# Patient Record
Sex: Male | Born: 1985 | Race: White | Hispanic: No | Marital: Single | Smoking: Never smoker
Health system: Southern US, Community
[De-identification: ages and names within clinical notes are randomized; demographics above are authoritative.]

---
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
## 2006-02-06 ENCOUNTER — Emergency Department (HOSPITAL_COMMUNITY): Admission: EM | Admit: 2006-02-06 | Discharge: 2006-02-06 | Payer: Self-pay | Admitting: Emergency Medicine

## 2007-05-17 IMAGING — CT CT HEAD W/O CM
1 series · 16 of 30 positions shown, 20 images · IV contrast (agent unspecified)
Comparison: 07/25/02.

CLINICAL DATA: Head injury with trauma.  Loss of consciousness.  Headache. 
 HEAD CT WITHOUT CONTRAST:
TECHNIQUE: Contiguous axial images were obtained from the base of the skull through the vertex according to standard protocol without contrast.

[Series 2: trauma head · axial · 0.49mm/px · z∈[+110,+246]mm · 16 of 32 slices shown, 20 images]
[im 2/32  brain]
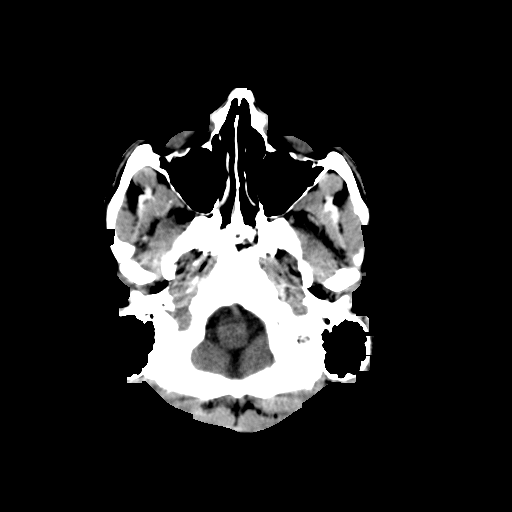
[im 2/32  bone]
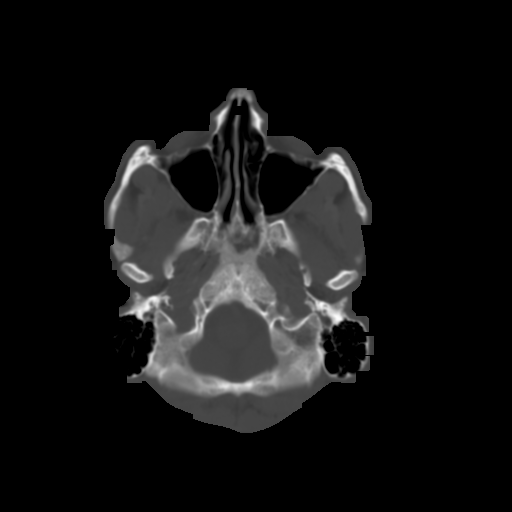
[im 4/32  brain]
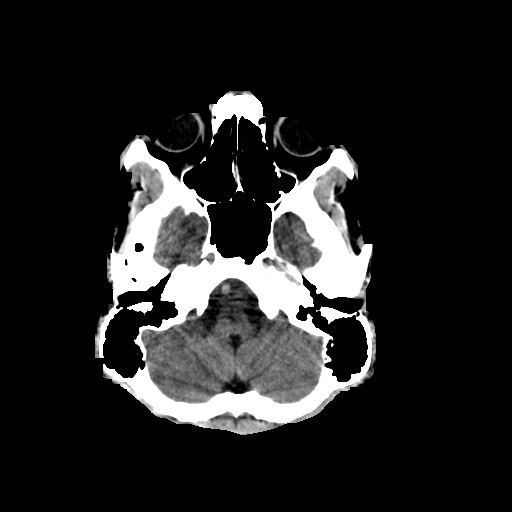
[im 6/32  brain]
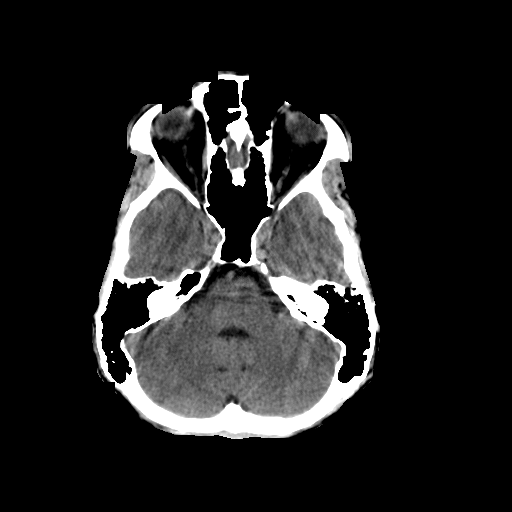
[im 8/32  brain]
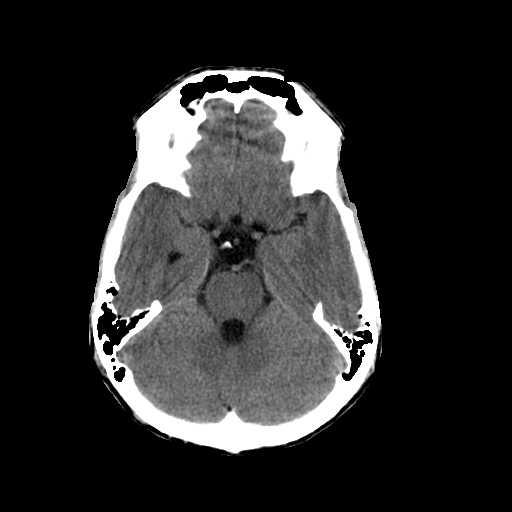
[im 9/32  brain]
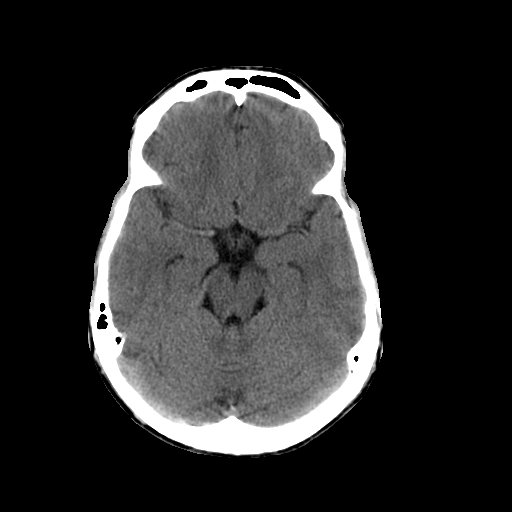
[im 9/32  bone]
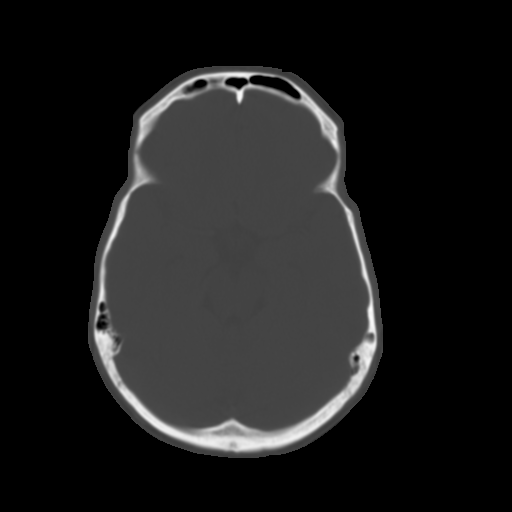
[im 11/32  brain]
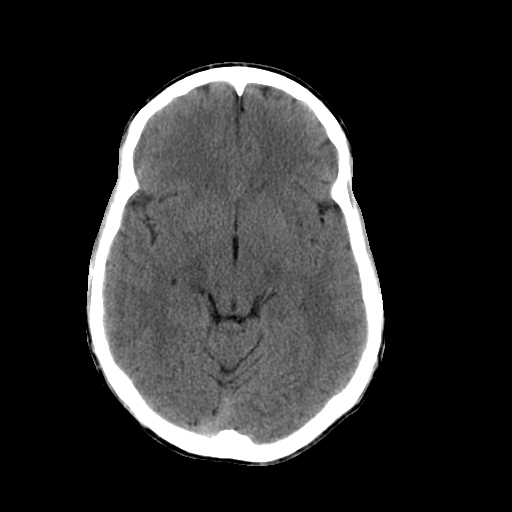
[im 13/32  brain]
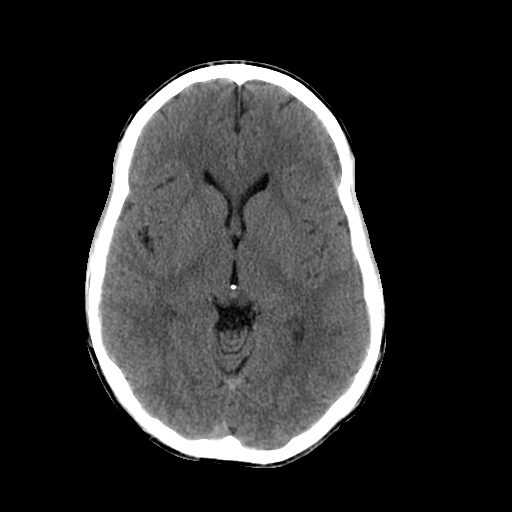
[im 15/32  brain]
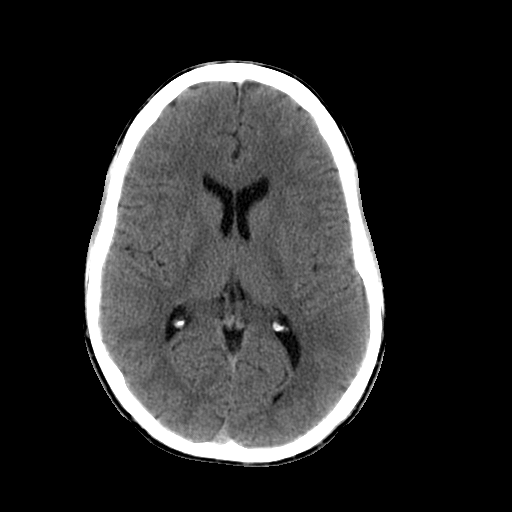
[im 17/32  brain]
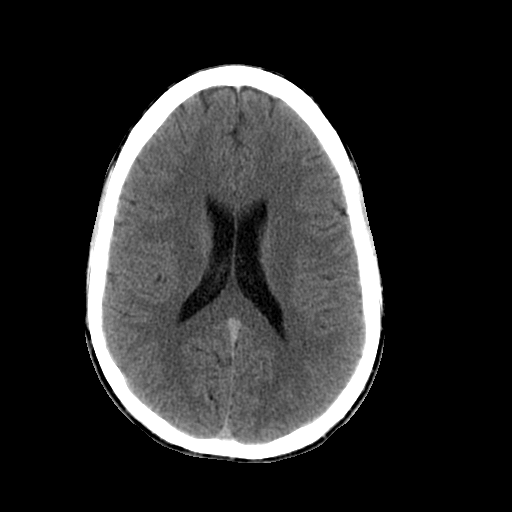
[im 17/32  bone]
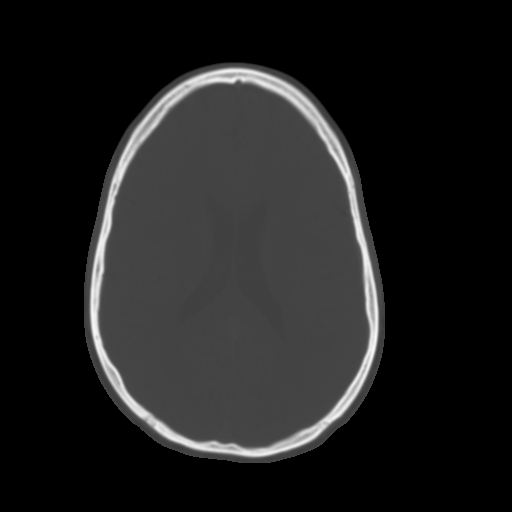
[im 19/32  brain]
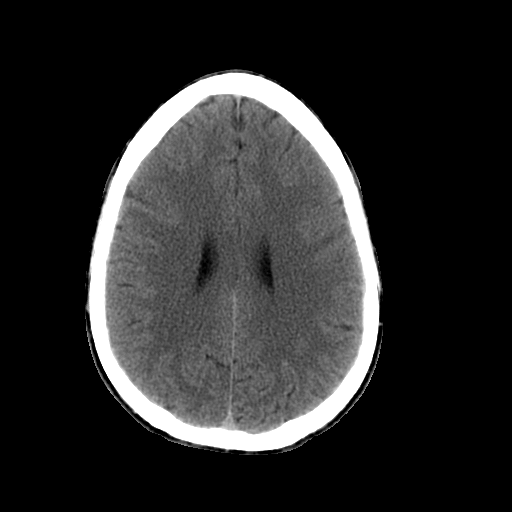
[im 21/32  brain]
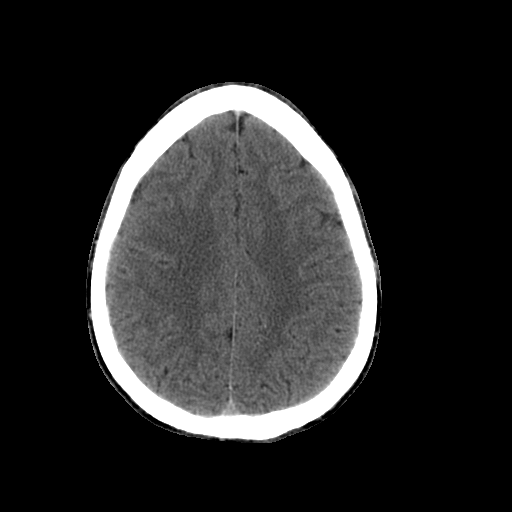
[im 23/32  brain]
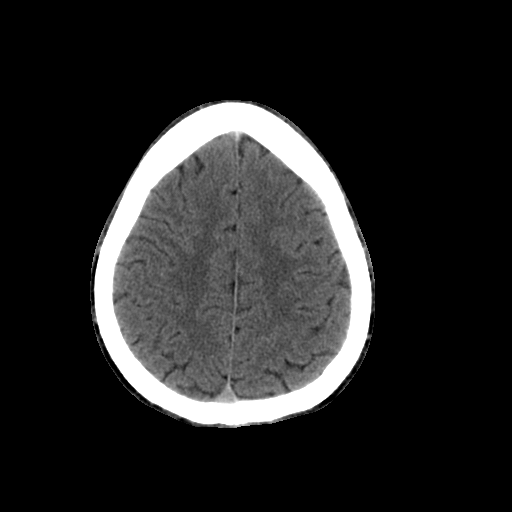
[im 24/32  brain]
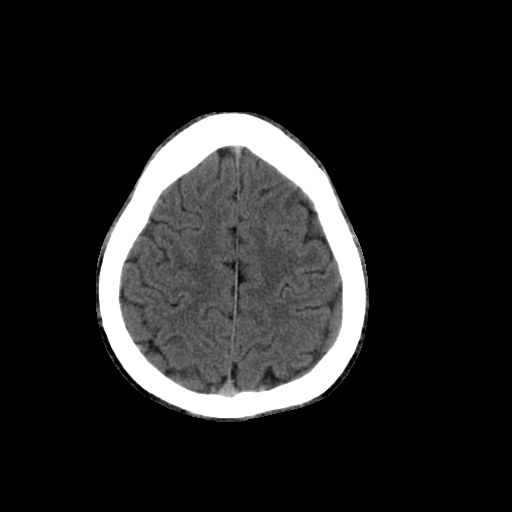
[im 24/32  bone]
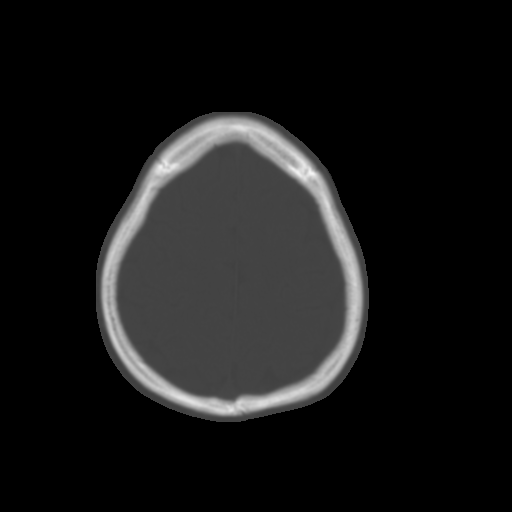
[im 26/32  brain]
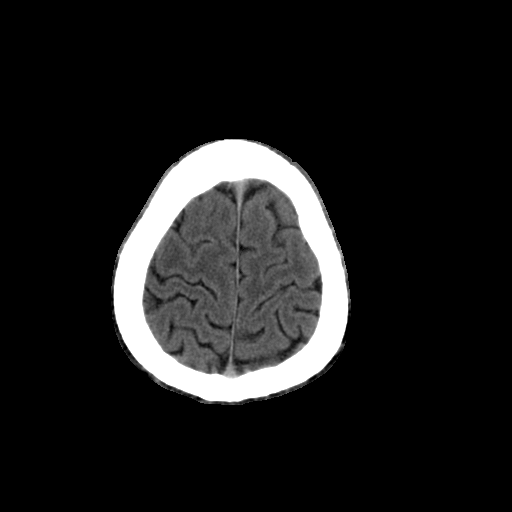
[im 28/32  brain]
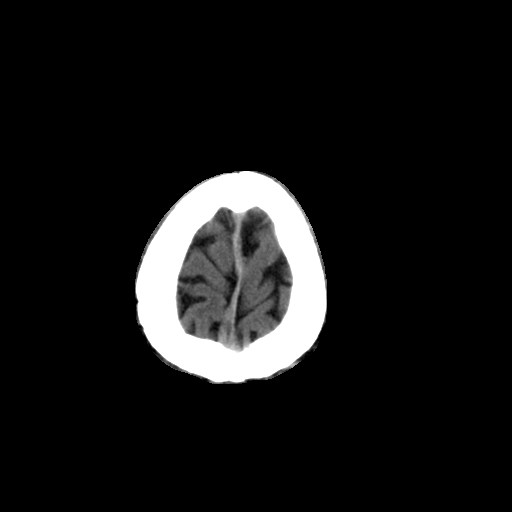
[im 30/32  brain]
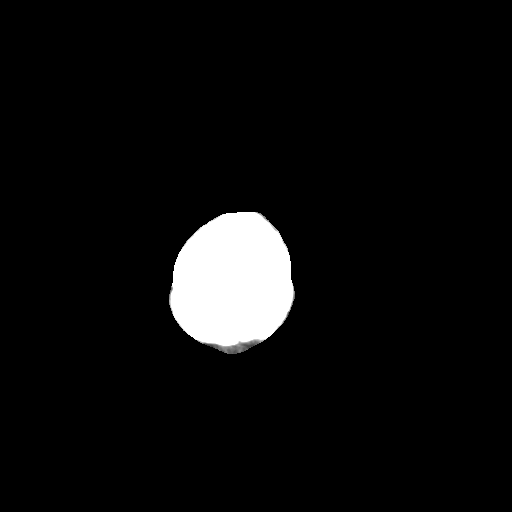

[16 of 30 positions shown; findings below may reference images not displayed]

FINDINGS: There is no evidence of intracranial hemorrhage, brain edema, or mass effect.  No other intra-axial abnormalities are seen, and the ventricles are within normal limits.  No abnormal extra-axial fluid collections or masses are identified.  No skull abnormalities are noted.
IMPRESSION: Negative non-contrast head CT.

## 2007-05-17 IMAGING — CR DG CERVICAL SPINE COMPLETE 4+V
6 series · 6 of 6 positions shown · non-contrast
Comparison: None.

CLINICAL DATA: Head trauma.  Injury at work.  Struck in the back of the head.
 CERVICAL SPINE ? 5 VIEW:

[view not recorded (1 of 6)]
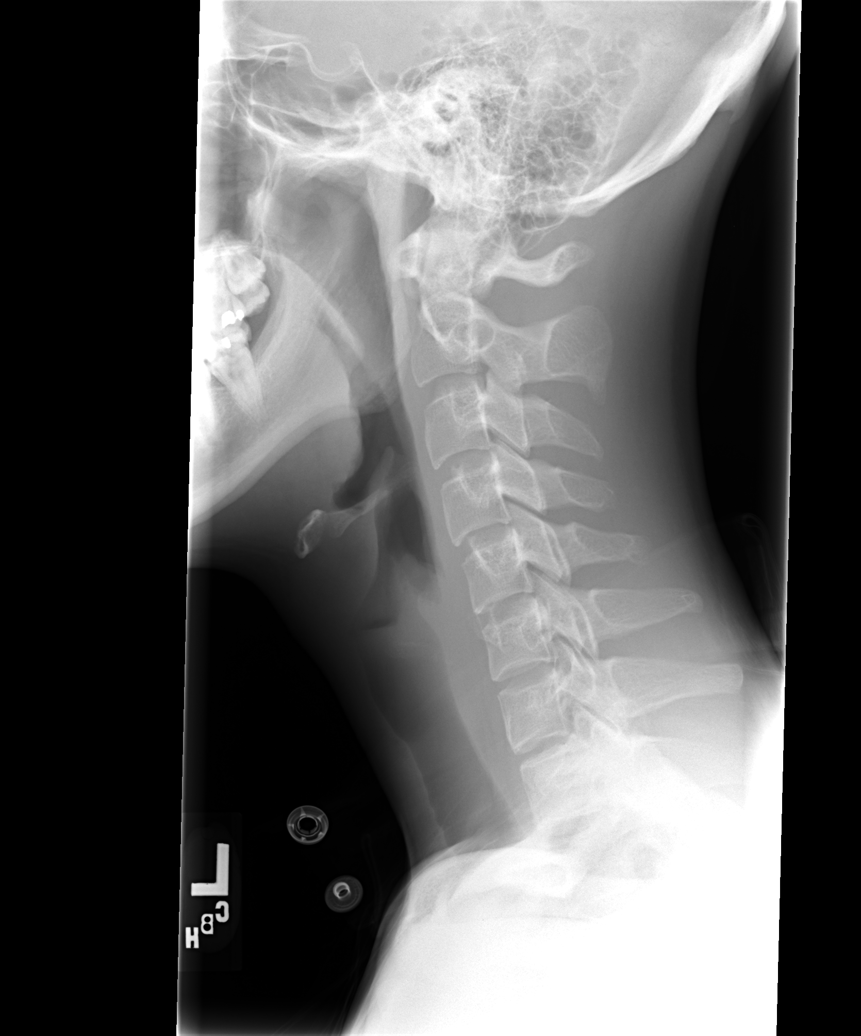

[view not recorded (2 of 6)]
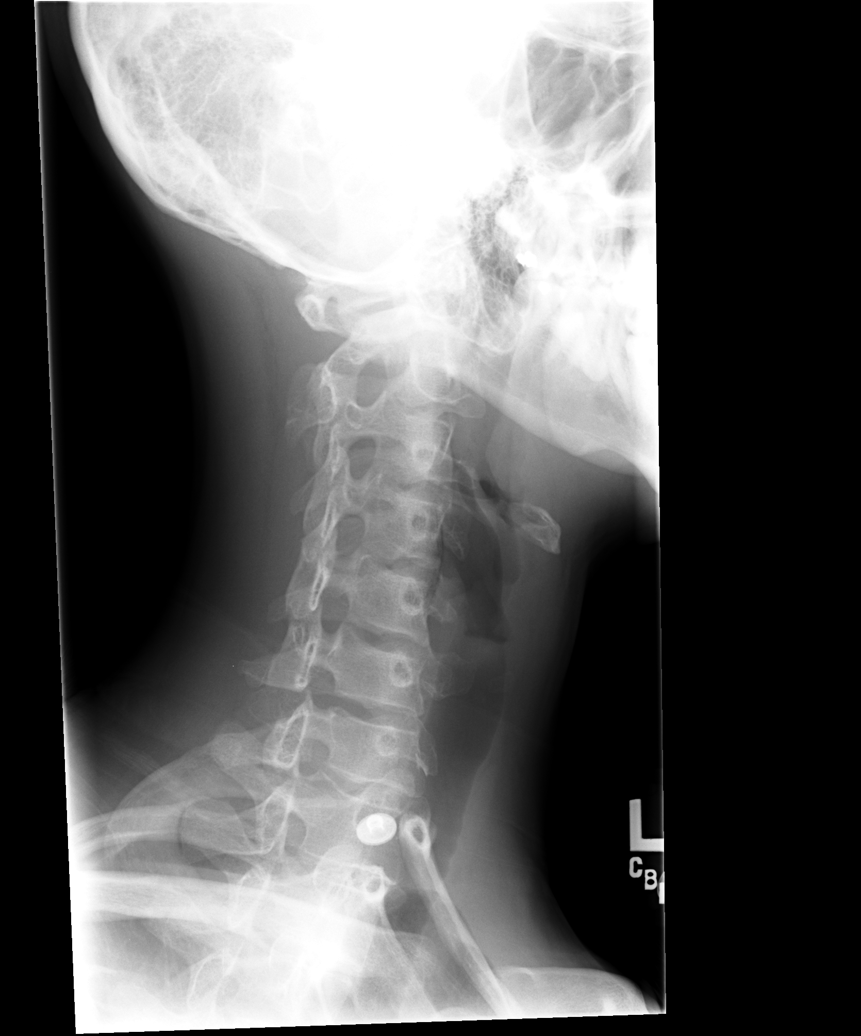

[view not recorded (3 of 6)]
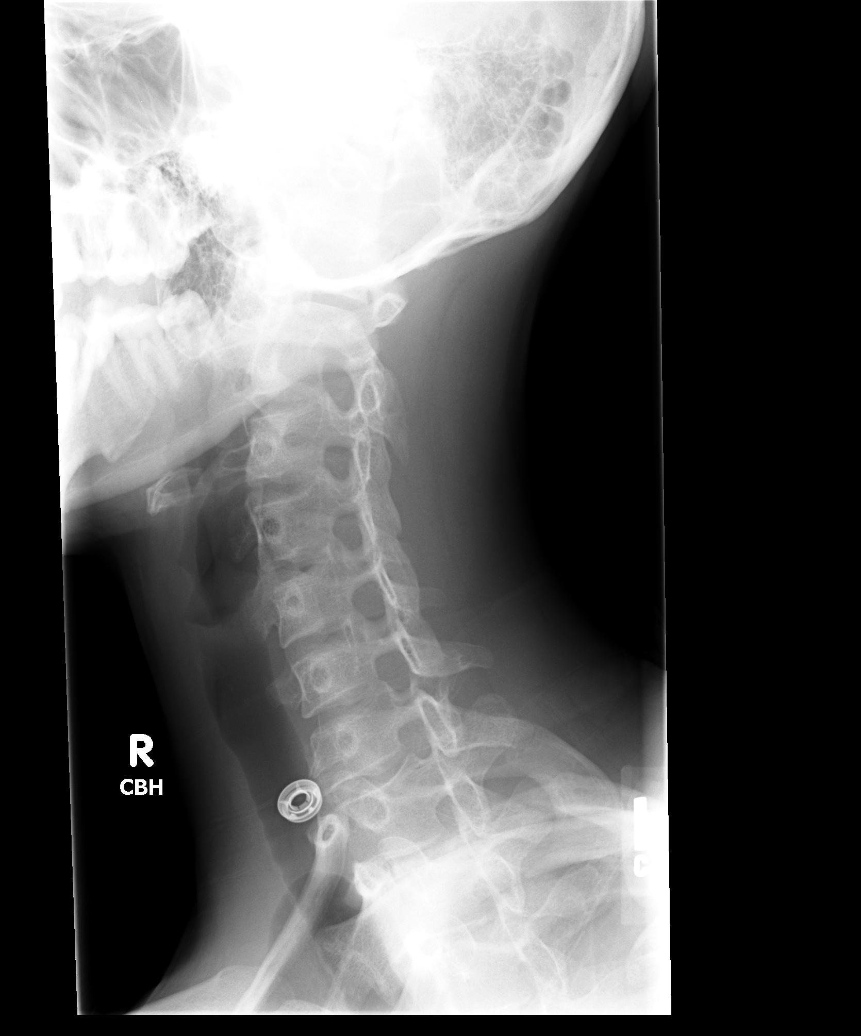

[view not recorded (4 of 6)]
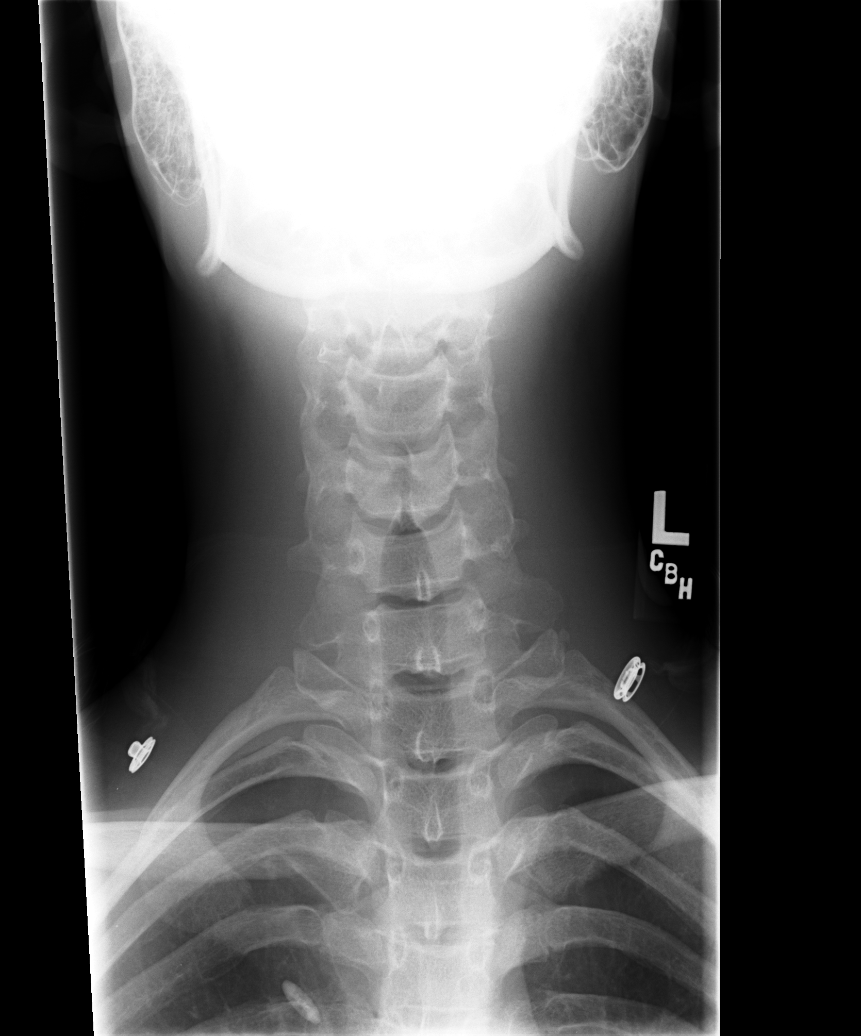

[view not recorded (5 of 6)]
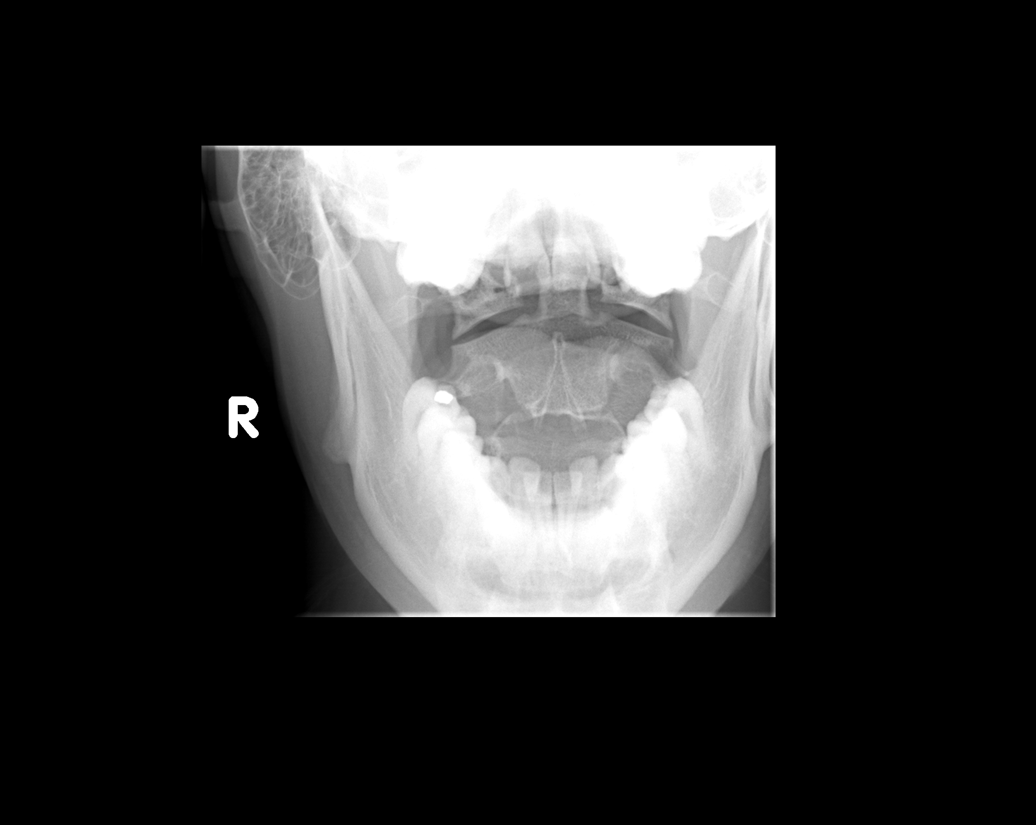

[view not recorded (6 of 6)]
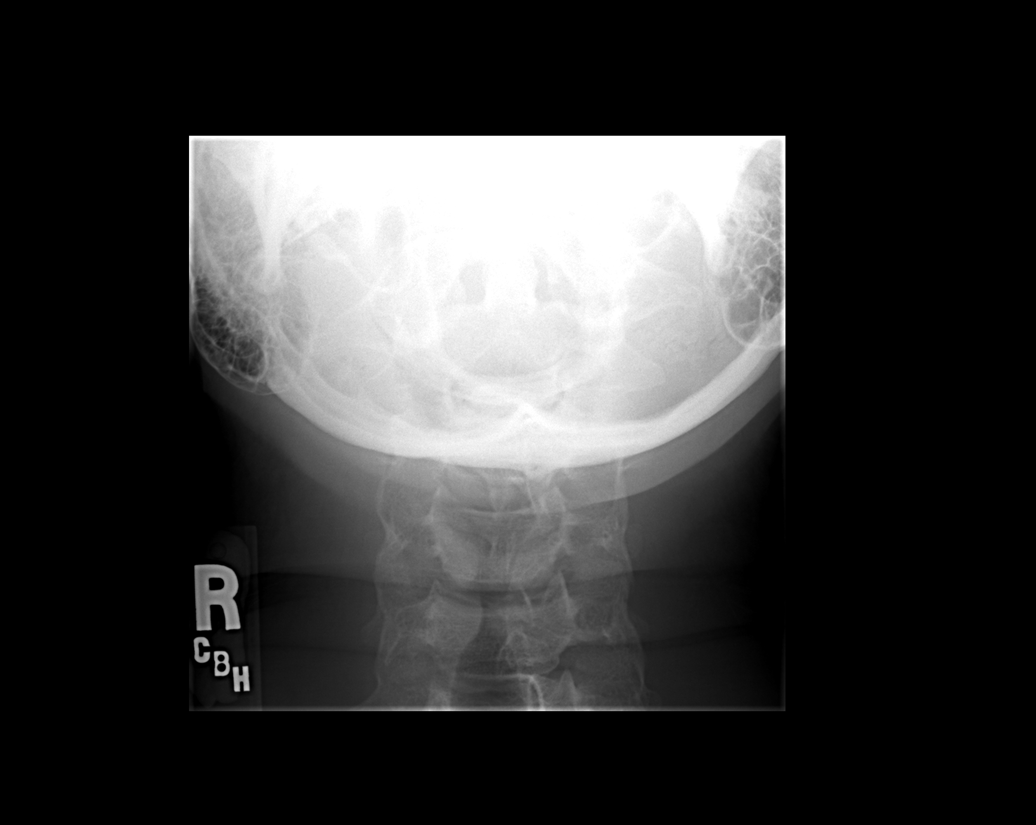

[6 of 6 positions shown; findings below may reference images not displayed]

FINDINGS: There is no evidence of cervical spine fracture or prevertebral soft tissue swelling.  Alignment is normal.  No other significant bone abnormalities are identified.
IMPRESSION: Negative.

## 2009-06-30 ENCOUNTER — Encounter: Admission: RE | Admit: 2009-06-30 | Discharge: 2009-06-30 | Payer: Self-pay | Admitting: Internal Medicine

## 2010-05-09 ENCOUNTER — Telehealth (INDEPENDENT_AMBULATORY_CARE_PROVIDER_SITE_OTHER): Payer: Self-pay

## 2010-05-09 ENCOUNTER — Encounter (INDEPENDENT_AMBULATORY_CARE_PROVIDER_SITE_OTHER): Payer: Self-pay

## 2010-05-10 ENCOUNTER — Ambulatory Visit: Payer: Self-pay | Admitting: Gastroenterology

## 2010-05-11 ENCOUNTER — Ambulatory Visit: Payer: Self-pay | Admitting: Gastroenterology

## 2010-05-12 ENCOUNTER — Encounter: Payer: Self-pay | Admitting: Gastroenterology

## 2010-05-25 ENCOUNTER — Ambulatory Visit: Payer: Self-pay | Admitting: Gastroenterology

## 2010-05-25 DIAGNOSIS — K5150 Left sided colitis without complications: Secondary | ICD-10-CM | POA: Insufficient documentation

## 2010-10-18 NOTE — Miscellaneous (Signed)
Summary: Canasa Rx  Clinical Lists Changes  Medications: Added new medication of CANASA 1000 MG  SUPP (MESALAMINE) One suppository per rectum every day. - Signed Rx of CANASA 1000 MG  SUPP (MESALAMINE) One suppository per rectum every day.;  #30 x 5;  Signed;  Entered by: Durwin Glaze RN;  Authorized by: Meryl Dare MD [facility];  Method [facility]: [facility]  [address] [phone]*, [address] [place], [place]  [address], Ph: [phone] or [phone], Fax: [phone]    Prescriptions: CANASA 1000 MG  SUPP (MESALAMINE) One suppository per rectum every day.  #30 x 5   Entered by:   Durwin Glaze RN   Authorized by:   Meryl Dare MD [facility]   Signed by:   Durwin Glaze RN on 05/11/2010   Method used:   [facility]  [address] [phone]* (retail)       [address]       [place], [place]  [address]       Ph: [phone] or [phone]       Fax: [phone]   RxID:   [id number]

## 2010-10-18 NOTE — Procedures (Signed)
Summary: Colonoscopy  Patient: Anthony Cantrell Note: All result statuses are Final unless otherwise noted.  Tests: (1) Colonoscopy (COL)   COL Colonoscopy           DONE     [facility]     [address]     [place], [place]  [address]           COLONOSCOPY PROCEDURE REPORT           PATIENT:  [name], [name]  MR#:  [id number]     BIRTHDATE:  Jan 17, 1986, 23 yrs. old  GENDER:  male     ENDOSCOPIST:  Judie Petit T. Russella Dar, MD, [facility]     Referred by:  Nila Nephew, M.D.     PROCEDURE DATE:  05/11/2010     PROCEDURE:  Colonoscopy with biopsy     ASA CLASS:  Class I     INDICATIONS:  1) hematochezia 2) proctitis and blood noted on     anoscopy by Dr. Elmore Guise.     MEDICATIONS:   Fentanyl 75 mcg IV, Versed 8 mg IV     DESCRIPTION OF PROCEDURE:   After the risks benefits and     alternatives of the procedure were thoroughly explained, informed     consent was obtained.  Digital rectal exam was performed and     revealed no abnormalities.   The LB [id number]-[id number] endoscope     was introduced through the anus and advanced to the terminal ileum     which was intubated for a short distance, without limitations.     The quality of the prep was excellent, using MoviPrep.  The     instrument was then slowly withdrawn as the colon was fully     examined.     <<PROCEDUREIMAGES>>     FINDINGS:  The terminal ileum appeared normal. It had mild, patchy     and erythema. Multiple biopsies were obtained and sent to     pathology.  Proctitis was identified in the rectum from anal verge     to 15cm. It was moderately severe, erythematous and granular.     Multiple biopsies were obtained and sent to pathology. A normal     appearing cecum, ileocecal valve, and appendiceal orifice were     identified. The ascending, hepatic flexure, transverse, splenic     flexure, descending, sigmoid colon appeared unremarkable. Random     biopsies were obtained and sent to pathology.  Retroflexed views     in the  rectum revealed no other findings other than those already     described. The time to cecum =  4.75  minutes. The scope was then     withdrawn (time =  9.5  min) from the patient and the procedure     completed.           COMPLICATIONS:  None           ENDOSCOPIC IMPRESSION:           1) Proctitis, moderately severe           RECOMMENDATIONS:     1) Await pathology results     2) Out patient follow-up in 2-4 weeks     3) Canasa 1000mg  supp hs           Malcolm T. Russella Dar, MD, [facility]           n.     eSIGNED:   Venita Lick. Stark at 05/11/2010 03:40  PM           Shmuel, Girgis, [id number]  Note: An exclamation mark (!) indicates a result that was not dispersed into the flowsheet. Document Creation Date: 05/11/2010 3:42 PM _______________________________________________________________________  (1) Order result status: Final Collection or observation date-time: 05/11/2010 15:29 Requested date-time:  Receipt date-time:  Reported date-time:  Referring Physician:   Ordering Physician: Claudette Head [phone]) Specimen Source:  Source: Launa Grill Order Number: [id number] Lab site:   Appended Document: Colonoscopy colon 04/2020  Appended Document: Colonoscopy     Procedures Next Due Date:    Colonoscopy: 04/2020

## 2010-10-18 NOTE — Assessment & Plan Note (Signed)
Summary: follow up colon/sheri   History of Present Illness Visit Type: Follow-up Visit Primary GI MD: Elie Goody MD [facility] Primary Provider: Nila Nephew, MD Requesting Provider: Nila Nephew, MD Chief Complaint: follow up colonoscopy.  Pt is doing well and has no GI complaints.  He is using suppositories only. History of Present Illness:   This is a return visit for left-sided colitis. Proctitis was endoscopically visible and left-sided colitis was diagnosed on biopsies. He has had an excellent response to Canasa suppositories with complete resolution of symptoms over the past week.   GI Review of Systems      Denies abdominal pain, acid reflux, belching, bloating, chest pain, dysphagia with liquids, dysphagia with solids, heartburn, loss of appetite, nausea, vomiting, vomiting blood, weight loss, and  weight gain.        Denies anal fissure, black tarry stools, change in bowel habit, constipation, diarrhea, diverticulosis, fecal incontinence, heme positive stool, hemorrhoids, irritable bowel syndrome, jaundice, light color stool, liver problems, rectal bleeding, and  rectal pain. Preventive Screening-Counseling & Management  Alcohol-Tobacco     Smoking Status: never  Caffeine-Diet-Exercise     Does Patient Exercise: yes      Drug Use:  no.     Current Medications (verified): 1)  Canasa 1000 Mg  Supp (Mesalamine) .... One Suppository Per Rectum Every Day. 2)  Lialda 1.2 Gm Tbec (Mesalamine) .... 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  Alcohol Use - yes social Daily Caffeine Use--coffee in AM Illicit Drug Use - no Patient gets regular exercise. Smoking Status:  never Drug Use:  no Does Patient Exercise:   yes  Review of Systems       The pertinent positives and negatives are noted as above and in the HPI. All other ROS were reviewed and were negative.   Vital Signs:  Patient profile:   25 year old male Height:      69 inches Weight:      142 pounds BMI:     21.05 Pulse rate:   60 / minute Pulse rhythm:   regular BP sitting:   110 / 66  (left arm) Cuff size:   regular  Vitals Entered By: Francee Piccolo CMA Duncan Dull) (May 25, 2010 9:41 AM)  Physical Exam  General:  Well developed, well nourished, no acute distress. Head:  Normocephalic and atraumatic. Eyes:  PERRLA, no icterus. Mouth:  No deformity or lesions, dentition normal. Lungs:  Clear throughout to auscultation. Heart:  Regular rate and rhythm; no murmurs, rubs,  or bruits. Abdomen:  Soft, nontender and nondistended. No masses, hepatosplenomegaly or hernias noted. Normal bowel sounds. Psych:  Alert and cooperative. Normal mood and affect.  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 Excellent response to Canasa suppositories. He has not filled his Lialda prescription since he was away on vacation. Begin Lialda as prescribed and after 2 weeks taper Canasa supp as outlined and discontinue. 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 Long-term treatment with a 5 ASA agent for suppression of inflammatory response and control of symptoms was discussed with the patient.  Patient Instructions: 1)  Start Lialda 2 tablets by  mouth every morning. 2)  After taking Lialda x 2 weeks reduce Canasa suppositories to every other day x 1 week, then reduce again to every 3 days x week and discontinue.  3)  Please schedule a follow-up appointment in 6 months. 4)  The medication list was reviewed and reconciled.  All changed / newly prescribed medications were explained.  A  complete medication list was provided to the patient / caregiver. 5)  Copy sent to : Nila Nephew, MD

## 2010-10-18 NOTE — Letter (Signed)
Summary: New Patient letter  [facility]  [address] [place], [place] [address]   Phone: [phone]  Fax: [phone]       05/12/2010 MRN: [id number]  Anthony Cantrell [address] [place], [place]  [address]  Dear Mr. BAUGHER,  Welcome to the Gastroenterology Division at [facility].    You are scheduled to see Dr.  Russella Dar  on 05/25/10 at  9:30 on the 3rd floor at [facility], [address]  We ask that you try to arrive at our office 15 minutes prior to your appointment time to allow for check-in.  We would like you to complete the enclosed self-administered evaluation form prior to your visit and bring it with you on the day of your appointment.  We will review it with you.  Also, please bring a complete list of all your medications or, if you prefer, bring the medication bottles and we will list them.  Please bring your insurance card so that we may make a copy of it.  If your insurance requires a referral to see a specialist, please bring your referral form from your primary care physician.  Co-payments are due at the time of your visit and may be paid by cash, check or credit card.     Your office visit will consist of a consult with your physician (includes a physical exam), any laboratory testing he/she may order, scheduling of any necessary diagnostic testing (e.g. x-ray, ultrasound, CT-scan), and scheduling of a procedure (e.g. Endoscopy, Colonoscopy) if required.  Please allow enough time on your schedule to allow for any/all of these possibilities.    If you cannot keep your appointment, please call [phone] to cancel or reschedule prior to your appointment date.  This allows [place] the opportunity to schedule an appointment for another patient in need of care.  If you do not cancel or reschedule by 5 p.m. the business day prior to your appointment date, you will be charged a $50.00 late cancellation/no-show fee.    Thank you for choosing  [facility] for your medical needs.  We appreciate the opportunity to care for you.  Please visit [place] at our website  to learn more about our practice.                     Sincerely,                                                             The Gastroenterology Division   Appended Document: New Patient letter letter mailed to patient's home

## 2010-10-18 NOTE — Letter (Signed)
Summary: [facility] Instructions  [facility]  [address] [place], [place] [address]   Phone: [phone]  Fax: [phone]       Anthony Cantrell    1986/01/24    MRN: [id number]        Procedure Day Dorna Bloom:  Wednesday 05/11/2010     Arrival Time: 2:00 pm     Procedure Time: 3:00 pm     Location of Procedure:                    _x _  [facility] (4th Floor)                        PREPARATION FOR COLONOSCOPY WITH MOVIPREP   Starting 5 days prior to your procedure Friday 8/19 do not eat nuts, seeds, popcorn, corn, beans, peas,  salads, or any raw vegetables.  Do not take any fiber supplements (e.g. Metamucil, Citrucel, and Benefiber).  THE DAY BEFORE YOUR PROCEDURE         DATE: Tuesday 8/23  1.  Drink clear liquids the entire day-NO SOLID FOOD  2.  Do not drink anything colored red or purple.  Avoid juices with pulp.  No orange juice.  3.  Drink at least 64 oz. (8 glasses) of fluid/clear liquids during the day to prevent dehydration and help the prep work efficiently.  CLEAR LIQUIDS INCLUDE: Water Jello Ice Popsicles Tea (sugar ok, no milk/cream) Powdered fruit flavored drinks Coffee (sugar ok, no milk/cream) Gatorade Juice: apple, white grape, white cranberry  Lemonade Clear bullion, consomm, broth Carbonated beverages (any kind) Strained chicken noodle soup Hard Candy                             4.  In the morning, mix first dose of MoviPrep solution:    Empty 1 Pouch A and 1 Pouch B into the disposable container    Add lukewarm drinking water to the top line of the container. Mix to dissolve    Refrigerate (mixed solution should be used within 24 hrs)  5.  Begin drinking the prep at 5:00 p.m. The MoviPrep container is divided by 4 marks.   Every 15 minutes drink the solution down to the next mark (approximately 8 oz) until the full liter is complete.   6.  Follow completed prep with 16 oz of clear liquid of your choice (Nothing  red or purple).  Continue to drink clear liquids until bedtime.  7.  Before going to bed, mix second dose of MoviPrep solution:    Empty 1 Pouch A and 1 Pouch B into the disposable container    Add lukewarm drinking water to the top line of the container. Mix to dissolve    Refrigerate  THE DAY OF YOUR PROCEDURE      DATE: Wednesday 8/24  Beginning at 10:00 a.m. (5 hours before procedure):         1. Every 15 minutes, drink the solution down to the next mark (approx 8 oz) until the full liter is complete.  2. Follow completed prep with 16 oz. of clear liquid of your choice.    3. You may drink clear liquids until 1:00 pm (2 HOURS BEFORE PROCEDURE).   MEDICATION INSTRUCTIONS  Unless otherwise instructed, you should take regular prescription medications with a small sip of water   as early as possible the morning of your  procedure.         OTHER INSTRUCTIONS  You will need a responsible adult at least 27 years of age to accompany you and drive you home.   This person must remain in the waiting room during your procedure.  Wear loose fitting clothing that is easily removed.  Leave jewelry and other valuables at home.  However, you may wish to bring a book to read or  an iPod/MP3 player to listen to music as you wait for your procedure to start.  Remove all body piercing jewelry and leave at home.  Total time from sign-in until discharge is approximately 2-3 hours.  You should go home directly after your procedure and rest.  You can resume normal activities the  day after your procedure.  The day of your procedure you should not:   Drive   Make legal decisions   Operate machinery   Drink alcohol   Return to work  You will receive specific instructions about eating, activities and medications before you leave.    The above instructions have been reviewed and explained to me by   Ulis Rias RN  May 10, 2010 8:06 AM     I fully understand and can  verbalize these instructions _____________________________ Date _________

## 2010-10-18 NOTE — Miscellaneous (Signed)
Summary: Lec previsit  Clinical Lists Changes  Medications: Added new medication of MOVIPREP 100 GM  SOLR (PEG-KCL-NACL-NASULF-NA ASC-C) As per prep instructions. - Signed Rx of MOVIPREP 100 GM  SOLR (PEG-KCL-NACL-NASULF-NA ASC-C) As per prep instructions.;  #1 x 0;  Signed;  Entered by: Ulis Rias RN;  Authorized by: Meryl Dare MD [facility];  Method used: [facility]  [address] [phone]*, [address], [place], [place]  [address], Ph: [phone] or [phone], Fax: [phone] Observations: Added new observation of NKA: T (05/10/2010 7:54)    Prescriptions: MOVIPREP 100 GM  SOLR (PEG-KCL-NACL-NASULF-NA ASC-C) As per prep instructions.  #1 x 0   Entered by:   Ulis Rias RN   Authorized by:   Meryl Dare MD [facility]   Signed by:   Ulis Rias RN on 05/10/2010   Method used:   [facility] to        [facility]  [address] [phone]* (retail)       [address]       [place], [place]  [address]       Ph: [phone] or [phone]       Fax: [phone]   RxID:   [id number]

## 2010-10-18 NOTE — Progress Notes (Signed)
Summary: Schedule Direct colon  Phone Note Outgoing Call Call back at Home Phone [phone]   Call placed by: Darcey Nora RN, CGRN,  May 09, 2010 2:05 PM Call placed to: Patient Summary of Call: Dr Russella Dar recieved a call from Dr Nila Nephew about scheduling a direct colon for patien.  Blood and mucous on anoscopy exam today.  Patient  scheduled for a direct colon 05/11/10 3:00 , pre-visit 05/10/10 8:00. Initial call taken by: Darcey Nora RN, CGRN,  May 09, 2010 2:07 PM

## 2010-11-15 ENCOUNTER — Encounter: Payer: Self-pay | Admitting: Gastroenterology

## 2010-11-24 NOTE — Miscellaneous (Signed)
Summary: Health [facility]ication [facility]/[employer]  Health [facility] Medication Mg[facility]/[employer]   Imported By: Sherian Rein 11/17/2010 15:07:43  _____________________________________________________________________  External Attachment:    Type:   Image     Comment:   External Document

## 2011-04-12 ENCOUNTER — Telehealth: Payer: Self-pay | Admitting: Gastroenterology

## 2011-04-12 MED ORDER — MESALAMINE 1.2 G PO TBEC: 2400.0000 mg | DELAYED_RELEASE_TABLET | Freq: Every day | ORAL | Status: DC

## 2011-04-12 MED ORDER — MESALAMINE 1000 MG RE SUPP: 1000.0000 mg | Freq: Every day | RECTAL | Status: DC

## 2011-04-12 NOTE — Telephone Encounter (Signed)
Agree 

## 2011-04-12 NOTE — Telephone Encounter (Signed)
Patient has a history of ulcerative colitis and ulcerative proctitis.  Patient stopped his lialda when his refills ran out earlier this year.  He has moved to [place] but wants to keep his GI care in [place].  He is having some loose stool, rectal bleeding and mucus.  I have given him 1 month refill on his Lialda and Canasa that he was on last fall.  He is scheduled for an office visit with Dr Russella Dar on 05/08/11.  I did review with him that this is a life long disease and requires that he take his maintenance Lialda even if he has no symptoms to maintain remission.  He will call me back if his symptoms don't improve if he needs an earlier appt.

## 2011-05-08 ENCOUNTER — Encounter: Payer: Self-pay | Admitting: Gastroenterology

## 2011-05-08 ENCOUNTER — Ambulatory Visit (INDEPENDENT_AMBULATORY_CARE_PROVIDER_SITE_OTHER): Payer: 59 | Admitting: Gastroenterology

## 2011-05-08 ENCOUNTER — Other Ambulatory Visit (INDEPENDENT_AMBULATORY_CARE_PROVIDER_SITE_OTHER): Payer: 59

## 2011-05-08 VITALS — BP 94/60 | HR 78 | Ht 69.0 in | Wt 141.0 lb

## 2011-05-08 DIAGNOSIS — K5130 Ulcerative (chronic) rectosigmoiditis without complications: Secondary | ICD-10-CM

## 2011-05-08 LAB — CBC WITH DIFFERENTIAL/PLATELET
Basophils Relative: 0.4 % (ref 0.0–3.0)
Eosinophils Relative: 5.2 % — ABNORMAL HIGH (ref 0.0–5.0)
HCT: 43.2 % (ref 39.0–52.0)
Hemoglobin: 14.3 g/dL (ref 13.0–17.0)
MCV: 89.1 fl (ref 78.0–100.0)
Monocytes Absolute: 0.7 10*3/uL (ref 0.1–1.0)
Neutrophils Relative %: 55.9 % (ref 43.0–77.0)
RBC: 4.85 Mil/uL (ref 4.22–5.81)
WBC: 6.6 10*3/uL (ref 4.5–10.5)

## 2011-05-08 LAB — URINALYSIS
Hgb urine dipstick: NEGATIVE
Total Protein, Urine: NEGATIVE
Urine Glucose: NEGATIVE

## 2011-05-08 LAB — BASIC METABOLIC PANEL
Chloride: 101 mEq/L (ref 96–112)
Creatinine, Ser: 0.9 mg/dL (ref 0.4–1.5)
Potassium: 4.1 mEq/L (ref 3.5–5.1)
Sodium: 138 mEq/L (ref 135–145)

## 2011-05-08 MED ORDER — MESALAMINE 1.2 G PO TBEC: 2400.0000 mg | DELAYED_RELEASE_TABLET | Freq: Every day | ORAL | Status: DC

## 2011-05-08 NOTE — Progress Notes (Signed)
History of Present Illness: This is a 25 year old male diagnosed with left-sided ulcerative colitis one year ago. He was treated with Lialda and Canasa suppositories and his symptoms completely resolved. After he was asymptomatic for several months he discontinued all medication. He had been advised to stay on Lialda long-term.  About one month ago he noted a return of bloody diarrhea and he restarted Lialda. He also has occasional episodes of constipation when he travels. 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 He denies abdominal pain, fevers, chills, weight loss, recent antibiotic usage.  Current Medications, Allergies, Past Medical History, Past Surgical History, Family History and Social History were reviewed in [employer] record.  Physical Exam: General: Well developed , well nourished, no acute distress Head: Normocephalic and atraumatic Eyes:  sclerae anicteric, EOMI Ears: Normal auditory acuity Mouth: No deformity or lesions Lungs: Clear throughout to auscultation Heart: Regular rate and rhythm; no murmurs, rubs or bruits Abdomen: Soft, non tender and non distended. No masses, hepatosplenomegaly or hernias noted. Normal Bowel sounds Musculoskeletal: Symmetrical with no gross deformities  Pulses:  Normal pulses noted Extremities: No clubbing, cyanosis, edema or deformities noted Neurological: Alert oriented x 4, grossly nonfocal Psychological:  Alert and cooperative. Normal mood and affect  Assessment and Recommendations:  1. Left-sided ulcerative colitis. Maintain Lialda 2.4 g daily. We discussed long-term treatment for chemoprophylaxis and maintenance of remission and he is agreeable to remain on medications. If he does not have complete resolution of his symptoms within the next 2-3 months he is to call for further followup. Obtain a CBC, BMET and urinalysis today.

## 2011-05-08 NOTE — Patient Instructions (Signed)
Go to the basement to have your labs drawn today.  Your prescription refill has been sent to your pharmacy.  cc: Nila Nephew, MD

## 2012-01-18 NOTE — ED Provider Notes (Signed)
.  I personally saw and examined the patient.  I have reviewed and agree with the MLP's findings, including all diagnostic interpretations, and plans as written.   I was present during the key portions of separately billed procedures.    Nechama Escutia I Cloy Cozzens, DO

## 2012-01-18 NOTE — ED Notes (Signed)
I have reviewed discharge instructions with the patient.  The patient verbalized understanding.

## 2012-01-18 NOTE — ED Provider Notes (Signed)
Patient is a 26 y.o. male presenting with groin pain. The history is provided by the patient.   Groin Pain  This is a new problem. The current episode started more than 2 days ago. The problem occurs constantly. The problem has been gradually worsening. Pertinent negatives include no chest pain, no abdominal pain, no headaches and no shortness of breath. The symptoms are aggravated by walking. Nothing relieves the symptoms. He has tried acetaminophen for the symptoms. The treatment provided mild relief.        Past Medical History   Diagnosis Date   ??? Gastrointestinal disorder      colitis        History reviewed. No pertinent past surgical history.      No family history on file.     History     Social History   ??? Marital Status: SINGLE     Spouse Name: N/A     Number of Children: N/A   ??? Years of Education: N/A     Occupational History   ??? Not on file.     Social History Main Topics   ??? Smoking status: Never Smoker    ??? Smokeless tobacco: Never Used   ??? Alcohol Use: Yes   ??? Drug Use:    ??? Sexually Active:      Other Topics Concern   ??? Not on file     Social History Narrative   ??? No narrative on file                  ALLERGIES: Review of patient's allergies indicates no known allergies.      Review of Systems   Constitutional: Negative for fever, activity change, appetite change and fatigue.   HENT: Negative for facial swelling, neck pain and neck stiffness.    Respiratory: Negative for chest tightness and shortness of breath.    Cardiovascular: Negative for chest pain.   Gastrointestinal: Negative for nausea, vomiting, abdominal pain and diarrhea.   Genitourinary: Negative for dysuria, decreased urine volume, discharge, scrotal swelling and testicular pain.   Musculoskeletal: Positive for myalgias, joint swelling and gait problem. Negative for back pain and arthralgias.   Neurological: Negative for dizziness, tremors, weakness, light-headedness and headaches.   All other systems reviewed and are negative.         Filed Vitals:    01/18/12 2200   Pulse: 78   Temp: 98.1 ??F (36.7 ??C)   Resp: 16   Height: 5\' 9"  (1.753 m)   Weight: 63.504 kg (140 lb)   SpO2: 99%            Physical Exam   Nursing note and vitals reviewed.  Constitutional: He is oriented to person, place, and time. Vital signs are normal. He appears well-developed and well-nourished. No distress.   HENT:   Head: Normocephalic and atraumatic.   Right Ear: External ear normal.   Left Ear: External ear normal.   Nose: Nose normal.   Mouth/Throat: Oropharynx is clear and moist.   Eyes: Conjunctivae and EOM are normal. Pupils are equal, round, and reactive to light. Right eye exhibits no discharge. Left eye exhibits no discharge.   Neck: Normal range of motion. Neck supple.   Cardiovascular: Normal rate, regular rhythm, normal heart sounds and intact distal pulses.  Exam reveals no gallop and no friction rub.    No murmur heard.  Pulmonary/Chest: Effort normal and breath sounds normal. He has no wheezes.   Abdominal: Soft. Bowel sounds  are normal. He exhibits no distension. There is no tenderness. Hernia confirmed negative in the right inguinal area and confirmed negative in the left inguinal area.   Genitourinary: Testes normal and penis normal. Right testis shows no mass and no tenderness. Left testis shows no mass and no tenderness.   Musculoskeletal:        Left hip: He exhibits decreased range of motion (pain with hip flexion). He exhibits normal strength, no tenderness, no bony tenderness, no swelling and no laceration.   Lymphadenopathy:        Right: No inguinal adenopathy present.        Left: No inguinal adenopathy present.   Neurological: He is alert and oriented to person, place, and time. He has normal reflexes.   Skin: Skin is warm and dry. He is not diaphoretic.   Psychiatric: He has a normal mood and affect. His behavior is normal. Judgment and thought content normal.        MDM     Differential Diagnosis; Clinical Impression; Plan:     Muscle strain    Amount and/or Complexity of Data Reviewed:    Discuss the patient with another provider:  Yes (Dr. Jed Limerick)  Risk of Significant Complications, Morbidity, and/or Mortality:   Presenting problems:  Low  Diagnostic procedures:  Low  Management options:  Low  Progress:   Patient progress:  Stable      Procedures    I have discussed the results of labs, procedures, radiographs, treatments as well as any previous results found within the [employer] with the patient and available family.?? A treatment plan was developed in conjunction with the patient and was agreed upon. The patient is ready for discharge at this time.?? All voiced understanding of the discharge plan and medication instructions or changes as appropriate.?? Questions about treatment in the ED were answered.?? The patient was encouraged to return should symptoms worsen or new problems develop. A follow up physician was provided to the patient on the discharge papers.

## 2012-01-18 NOTE — ED Notes (Signed)
Pt seen and examined by me. Most likely muscle strain.   .I personally saw and examined the patient.  I have reviewed and agree with the MLP's findings, including all diagnostic interpretations, and plans as written.   I was present during the key portions of separately billed procedures.    Sarita Haver, DO

## 2012-01-19 MED ORDER — HYDROCODONE-ACETAMINOPHEN 7.5 MG-325 MG TAB
ORAL_TABLET | Freq: Four times a day (QID) | ORAL | Status: DC | PRN
Start: 2012-01-19 — End: 2015-03-22

## 2012-03-04 ENCOUNTER — Telehealth: Payer: Self-pay | Admitting: Gastroenterology

## 2012-03-04 MED ORDER — MESALAMINE 1000 MG RE SUPP: 1000.0000 mg | Freq: Every day | RECTAL | Status: DC

## 2012-03-04 MED ORDER — MESALAMINE 1.2 G PO TBEC: 2400.0000 mg | DELAYED_RELEASE_TABLET | Freq: Every day | ORAL | Status: DC

## 2012-03-04 NOTE — Telephone Encounter (Signed)
Prescriptions sent to patient's pharmacy and pt told to keep his appt for any further refills.

## 2012-03-19 ENCOUNTER — Ambulatory Visit: Payer: 59 | Admitting: Gastroenterology

## 2012-04-23 ENCOUNTER — Ambulatory Visit: Payer: 59 | Admitting: Gastroenterology

## 2012-05-22 ENCOUNTER — Ambulatory Visit: Payer: 59 | Admitting: Gastroenterology

## 2012-06-19 ENCOUNTER — Other Ambulatory Visit (INDEPENDENT_AMBULATORY_CARE_PROVIDER_SITE_OTHER): Payer: 59

## 2012-06-19 ENCOUNTER — Encounter: Payer: Self-pay | Admitting: Gastroenterology

## 2012-06-19 ENCOUNTER — Ambulatory Visit (INDEPENDENT_AMBULATORY_CARE_PROVIDER_SITE_OTHER): Payer: 59 | Admitting: Gastroenterology

## 2012-06-19 VITALS — BP 110/70 | HR 72 | Ht 69.5 in | Wt 140.2 lb

## 2012-06-19 DIAGNOSIS — K5130 Ulcerative (chronic) rectosigmoiditis without complications: Secondary | ICD-10-CM

## 2012-06-19 LAB — COMPREHENSIVE METABOLIC PANEL
ALT: 13 U/L (ref 0–53)
Albumin: 4.4 g/dL (ref 3.5–5.2)
CO2: 29 mEq/L (ref 19–32)
Calcium: 9.4 mg/dL (ref 8.4–10.5)
Chloride: 101 mEq/L (ref 96–112)
GFR: 100.41 mL/min (ref >60.00)
Glucose, Bld: 81 mg/dL (ref 70–99)
Sodium: 137 mEq/L (ref 135–145)
Total Protein: 7.9 g/dL (ref 6.0–8.3)

## 2012-06-19 LAB — CBC WITH DIFFERENTIAL/PLATELET
Basophils Absolute: 0 10*3/uL (ref 0.0–0.1)
Lymphocytes Relative: 14.4 % (ref 12.0–46.0)
Lymphs Abs: 1.6 10*3/uL (ref 0.7–4.0)
Monocytes Relative: 6.4 % (ref 3.0–12.0)
Neutrophils Relative %: 78.5 % — ABNORMAL HIGH (ref 43.0–77.0)
Platelets: 278 10*3/uL (ref 150.0–400.0)
RDW: 12.6 % (ref 11.5–14.6)
WBC: 11.3 10*3/uL — ABNORMAL HIGH (ref 4.5–10.5)

## 2012-06-19 LAB — URINALYSIS
Bilirubin Urine: NEGATIVE
Leukocytes, UA: NEGATIVE
Nitrite: NEGATIVE
Specific Gravity, Urine: 1.01 (ref 1.000–1.030)
pH: 7 (ref 5.0–8.0)

## 2012-06-19 MED ORDER — MESALAMINE 1000 MG RE SUPP: 1000.0000 mg | Freq: Every day | RECTAL | Status: DC

## 2012-06-19 MED ORDER — MESALAMINE 1.2 G PO TBEC: 2400.0000 mg | DELAYED_RELEASE_TABLET | Freq: Every day | ORAL | Status: DC

## 2012-06-19 NOTE — Patient Instructions (Addendum)
We have sent the following medications to your pharmacy for you to pick up at your convenience:Lialda, Canasa   Your physician has requested that you go to the basement for lab work before leaving today  Please follow up in one year as needed with Dr. Russella Dar

## 2012-06-19 NOTE — Progress Notes (Signed)
History of Present Illness: This is a 26 year old male with left-sided ulcerative colitis. He states he occasionally misses doses of Lialda. He has run out of medication and been without medications for a few weeks and notices return of symptoms. Since he is generally staying on medication regularly for the past 6 months he has had no problems.  Current Medications, Allergies, Past Medical History, Past Surgical History, Family History and Social History were reviewed in [employer] record.  Physical Exam: General: Well developed , well nourished, no acute distress Head: Normocephalic and atraumatic Eyes:  sclerae anicteric, EOMI Ears: Normal auditory acuity Mouth: No deformity or lesions Lungs: Clear throughout to auscultation Heart: Regular rate and rhythm; no murmurs, rubs or bruits Abdomen: Soft, non tender and non distended. No masses, hepatosplenomegaly or hernias noted. Normal Bowel sounds Musculoskeletal: Symmetrical with no gross deformities  Pulses:  Normal pulses noted Extremities: No clubbing, cyanosis, edema or deformities noted Neurological: Alert oriented x 4, grossly nonfocal Psychological:  Alert and cooperative. Normal mood and affect  Assessment and Recommendations:  1. Ulcerative colitis, left sided. Maintain Lialda 2.4 g daily. We discussed long-term treatment for chemoprophylaxis and maintenance of remission and he wants to remain on Lialda. Obtain a CBC, BMET and urinalysis today.

## 2013-04-07 ENCOUNTER — Other Ambulatory Visit: Payer: Self-pay | Admitting: Gastroenterology

## 2013-07-31 ENCOUNTER — Other Ambulatory Visit: Payer: Self-pay | Admitting: Gastroenterology

## 2013-10-22 ENCOUNTER — Encounter: Payer: Self-pay | Admitting: Gastroenterology

## 2013-10-22 ENCOUNTER — Ambulatory Visit (INDEPENDENT_AMBULATORY_CARE_PROVIDER_SITE_OTHER): Payer: 59 | Admitting: Gastroenterology

## 2013-10-22 ENCOUNTER — Other Ambulatory Visit (INDEPENDENT_AMBULATORY_CARE_PROVIDER_SITE_OTHER): Payer: 59

## 2013-10-22 VITALS — BP 118/82 | HR 76 | Ht 69.0 in | Wt 145.8 lb

## 2013-10-22 DIAGNOSIS — K5150 Left sided colitis without complications: Secondary | ICD-10-CM

## 2013-10-22 LAB — CBC WITH DIFFERENTIAL/PLATELET
BASOS ABS: 0 10*3/uL (ref 0.0–0.1)
Basophils Relative: 0.3 % (ref 0.0–3.0)
Eosinophils Absolute: 0.1 10*3/uL (ref 0.0–0.7)
Eosinophils Relative: 0.5 % (ref 0.0–5.0)
HCT: 44.7 % (ref 39.0–52.0)
Hemoglobin: 15 g/dL (ref 13.0–17.0)
LYMPHS ABS: 1.9 10*3/uL (ref 0.7–4.0)
LYMPHS PCT: 17.4 % (ref 12.0–46.0)
MCHC: 33.5 g/dL (ref 30.0–36.0)
MCV: 88.5 fl (ref 78.0–100.0)
MONOS PCT: 6.8 % (ref 3.0–12.0)
Monocytes Absolute: 0.8 10*3/uL (ref 0.1–1.0)
Neutro Abs: 8.3 10*3/uL — ABNORMAL HIGH (ref 1.4–7.7)
Neutrophils Relative %: 75 % (ref 43.0–77.0)
Platelets: 277 10*3/uL (ref 150.0–400.0)
RBC: 5.05 Mil/uL (ref 4.22–5.81)
RDW: 12.8 % (ref 11.5–14.6)
WBC: 11.1 10*3/uL — ABNORMAL HIGH (ref 4.5–10.5)

## 2013-10-22 LAB — URINALYSIS
BILIRUBIN URINE: NEGATIVE
Hgb urine dipstick: NEGATIVE
KETONES UR: NEGATIVE
LEUKOCYTES UA: NEGATIVE
Nitrite: NEGATIVE
SPECIFIC GRAVITY, URINE: 1.01 (ref 1.000–1.030)
Total Protein, Urine: NEGATIVE
Urine Glucose: NEGATIVE
Urobilinogen, UA: 0.2 (ref 0.0–1.0)
pH: 6 (ref 5.0–8.0)

## 2013-10-22 LAB — COMPREHENSIVE METABOLIC PANEL
ALT: 15 U/L (ref 0–53)
AST: 15 U/L (ref 0–37)
Albumin: 4.4 g/dL (ref 3.5–5.2)
Alkaline Phosphatase: 88 U/L (ref 39–117)
BUN: 13 mg/dL (ref 6–23)
CALCIUM: 9.3 mg/dL (ref 8.4–10.5)
CHLORIDE: 101 meq/L (ref 96–112)
CO2: 27 meq/L (ref 19–32)
Creatinine, Ser: 1 mg/dL (ref 0.4–1.5)
GFR: 99.45 mL/min (ref >60.00)
Glucose, Bld: 84 mg/dL (ref 70–99)
POTASSIUM: 4.2 meq/L (ref 3.5–5.1)
SODIUM: 136 meq/L (ref 135–145)
TOTAL PROTEIN: 8 g/dL (ref 6.0–8.3)
Total Bilirubin: 0.6 mg/dL (ref 0.3–1.2)

## 2013-10-22 MED ORDER — MESALAMINE 1.2 G PO TBEC: 2400.0000 mg | DELAYED_RELEASE_TABLET | Freq: Every day | ORAL | Status: DC

## 2013-10-22 MED ORDER — MESALAMINE 1000 MG RE SUPP: RECTAL | Status: DC

## 2013-10-22 NOTE — Progress Notes (Signed)
    History of Present Illness: This is a 28 year old male left-sided colitis. He states he has had about 3 flares of this past year he describes his flares as having urgency and minor rectal bleeding. He is not always compliant with taking PO mesalamine.  Current Medications, Allergies, Past Medical History, Past Surgical History, Family History and Social History were reviewed in [facility] electronic medical record.  Physical Exam: General: Well developed , well nourished, no acute distress Head: Normocephalic and atraumatic Eyes:  sclerae anicteric, EOMI Ears: Normal auditory acuity Mouth: No deformity or lesions Lungs: Clear throughout to auscultation Heart: Regular rate and rhythm; no murmurs, rubs or bruits Abdomen: Soft, non tender and non distended. No masses, hepatosplenomegaly or hernias noted. Normal Bowel sounds Musculoskeletal: Symmetrical with no gross deformities  Pulses:  Normal pulses noted Extremities: No clubbing, cyanosis, edema or deformities noted Neurological: Alert oriented x 4, grossly nonfocal Psychological:  Alert and cooperative. Normal mood and affect  Assessment and Recommendations:  1. Left sided UC. Maintain Lialda 2.4 g daily. We discussed long-term treatment for chemoprophylaxis and maintenance of remission and he wants to remain on Lialda. I emphasized daily long-term compliance with Lialda. Obtain a CBC, BMET and urinalysis today. Advised him to contact us when he has a flare to consider temporarily adding 5-ASA enemas, hydrocortisone enemas or budesonide orally.

## 2013-10-22 NOTE — Patient Instructions (Signed)
Your physician has requested that you go to the basement for the following lab work before leaving today:Cmet, CBC, Urinalysis.  We have sent the following medications to your pharmacy for you to pick up at your convenience:Lialda and Canasa.  Thank you for choosing me and [facility].  Venita LickMalcolm T. Pleas KochStark, Jr., MD., Clementeen GrahamFACG  cc: Nila NephewEdwin Green, MD

## 2014-01-05 ENCOUNTER — Other Ambulatory Visit: Payer: Self-pay | Admitting: Gastroenterology

## 2014-04-11 ENCOUNTER — Other Ambulatory Visit: Payer: Self-pay | Admitting: Gastroenterology

## 2014-05-29 ENCOUNTER — Telehealth: Payer: Self-pay | Admitting: Gastroenterology

## 2014-05-29 MED ORDER — MESALAMINE 1000 MG RE SUPP: RECTAL | Status: DC

## 2014-05-29 NOTE — Telephone Encounter (Signed)
Prescription sent to patient's pharmacy for 90 day supply with no refills. Pt notified.

## 2014-09-18 ENCOUNTER — Other Ambulatory Visit: Payer: Self-pay | Admitting: Gastroenterology

## 2014-09-21 NOTE — Telephone Encounter (Signed)
Needs an appt for any further refills

## 2014-11-20 ENCOUNTER — Telehealth: Payer: Self-pay | Admitting: Gastroenterology

## 2014-11-20 MED ORDER — MESALAMINE 1000 MG RE SUPP: RECTAL | Status: DC

## 2014-11-20 MED ORDER — MESALAMINE 1.2 G PO TBEC: DELAYED_RELEASE_TABLET | ORAL | Status: DC

## 2014-11-20 NOTE — Telephone Encounter (Signed)
Tried to get patient sooner appt but informed him Dr. Russella DarStark does not have any openings until April. Patient understood and will try to make April's appt.

## 2014-11-20 NOTE — Telephone Encounter (Signed)
Patient states he had to reschedule his appointment last time due to work conflicts but rescheduled for April. Patient states his work schedule comes out mid month so he isn't really sure if he can come on that day in April. Told patient that I can give him a time in March if needed since his work schedule is already out for March but he really needs to follow up since he has Ulcerative colitis. Patient agreed and patient states he will call back this afternoon to try to reschedule for March. Prescriptions sent to patient's pharmacy.

## 2014-11-24 ENCOUNTER — Ambulatory Visit: Payer: Self-pay | Admitting: Gastroenterology

## 2014-12-07 ENCOUNTER — Ambulatory Visit: Payer: Self-pay | Admitting: Gastroenterology

## 2014-12-23 ENCOUNTER — Ambulatory Visit: Payer: Self-pay | Admitting: Gastroenterology

## 2014-12-28 ENCOUNTER — Other Ambulatory Visit: Payer: Self-pay | Admitting: Gastroenterology

## 2014-12-28 NOTE — Telephone Encounter (Signed)
Left message for patient to call back. [facility] [place]? The number provided in this message is not a working number.

## 2014-12-29 MED ORDER — MESALAMINE 1.2 G PO TBEC: DELAYED_RELEASE_TABLET | ORAL | Status: DC

## 2014-12-29 NOTE — Telephone Encounter (Signed)
[facility] called back [facility] [address] [place] [id number]. Cannot find in system so phoned into pharmacy with no refills.

## 2015-01-01 ENCOUNTER — Telehealth: Payer: Self-pay | Admitting: Gastroenterology

## 2015-01-01 MED ORDER — MESALAMINE 1.2 G PO TBEC: DELAYED_RELEASE_TABLET | ORAL | Status: DC

## 2015-01-01 NOTE — Telephone Encounter (Signed)
Informed patient that I will [facility] until scheduled appt next week. Pt verbalized understanding.

## 2015-01-06 ENCOUNTER — Encounter: Payer: Self-pay | Admitting: Gastroenterology

## 2015-01-06 ENCOUNTER — Ambulatory Visit (INDEPENDENT_AMBULATORY_CARE_PROVIDER_SITE_OTHER): Payer: 59 | Admitting: Gastroenterology

## 2015-01-06 VITALS — BP 120/80 | HR 60 | Ht 69.0 in | Wt 142.0 lb

## 2015-01-06 DIAGNOSIS — K51911 Ulcerative colitis, unspecified with rectal bleeding: Secondary | ICD-10-CM | POA: Diagnosis not present

## 2015-01-06 MED ORDER — MESALAMINE 1000 MG RE SUPP: RECTAL | Status: DC

## 2015-01-06 MED ORDER — BUDESONIDE 9 MG PO TB24: 9.0000 mg | ORAL_TABLET | Freq: Every day | ORAL | Status: AC

## 2015-01-06 NOTE — Progress Notes (Signed)
    History of Present Illness: This is a 29 year old male with a history of left-sided colitis. For the past 6 months he has had mild symptom activity described as intermittent looser stools with small amounts of bright red blood per rectum. Denies abdominal pain. He states he has been compliant with medications.  Current Medications, Allergies, Past Medical History, Past Surgical History, Family History and Social History were reviewed in [facility] electronic medical record.  Physical Exam: General: Well developed , well nourished, no acute distress Head: Normocephalic and atraumatic Eyes:  sclerae anicteric, EOMI Ears: Normal auditory acuity Mouth: No deformity or lesions Lungs: Clear throughout to auscultation Heart: Regular rate and rhythm; no murmurs, rubs or bruits Abdomen: Soft, non tender and non distended. No masses, hepatosplenomegaly or hernias noted. Normal Bowel sounds Musculoskeletal: Symmetrical with no gross deformities  Pulses:  Normal pulses noted Extremities: No clubbing, cyanosis, edema or deformities noted Neurological: Alert oriented x 4, grossly nonfocal Psychological:  Alert and cooperative. Normal mood and affect  Assessment and Recommendations:  1. Left-sided colitis. Mild symptom activity. Uceris 9 mg daily for 6 weeks. Continue Lialda 2.4 g daily and Canasa 1 g suppositories daily. REV in 3 months. Call if symptoms not improving.

## 2015-01-06 NOTE — Patient Instructions (Signed)
We have sent the following medications to your pharmacy for you to pick up at your convenience:Uceris 9 mg one tablet by mouth once daily x 6 weeks and Canasa suppositories.   Your follow up appointment with Dr. Russella DarStark is on 03/03/15 at 9:15am. If you need to reschedule or cancel please call our office at [phone].  Thank you for choosing me and [facility].  Venita LickMalcolm T. Pleas KochStark, Jr., MD., Clementeen GrahamFACG

## 2015-03-03 ENCOUNTER — Encounter: Payer: Self-pay | Admitting: Gastroenterology

## 2015-03-03 ENCOUNTER — Ambulatory Visit: Payer: 59 | Admitting: Gastroenterology

## 2015-03-22 ENCOUNTER — Emergency Department: Admit: 2015-03-22 | Payer: PRIVATE HEALTH INSURANCE | Primary: Family Medicine

## 2015-03-22 ENCOUNTER — Inpatient Hospital Stay
Admit: 2015-03-22 | Discharge: 2015-03-23 | Disposition: A | Discharge status: Home / Self Care | Payer: PRIVATE HEALTH INSURANCE | Attending: Emergency Medicine

## 2015-03-22 DIAGNOSIS — L03114 Cellulitis of left upper limb: Secondary | ICD-10-CM

## 2015-03-22 LAB — CBC WITH AUTOMATED DIFF
ABS. BASOPHILS: 0.1 10*3/uL (ref 0.0–0.2)
ABS. EOSINOPHILS: 0.1 10*3/uL (ref 0.0–0.8)
ABS. IMM. GRANS.: 0 10*3/uL (ref 0.0–0.5)
ABS. LYMPHOCYTES: 2.7 10*3/uL (ref 0.5–4.6)
ABS. MONOCYTES: 1 10*3/uL (ref 0.1–1.3)
ABS. NEUTROPHILS: 7.5 10*3/uL (ref 1.7–8.2)
BASOPHILS: 1 % (ref 0.0–2.0)
EOSINOPHILS: 1 % (ref 0.5–7.8)
HCT: 42.4 % (ref 41.1–50.3)
HGB: 14.2 g/dL (ref 13.6–17.2)
IMMATURE GRANULOCYTES: 0.3 % (ref 0.0–5.0)
LYMPHOCYTES: 24 % (ref 13–44)
MCH: 29.1 PG (ref 26.1–32.9)
MCHC: 33.5 g/dL (ref 31.4–35.0)
MCV: 86.9 FL (ref 79.6–97.8)
MONOCYTES: 9 % (ref 4.0–12.0)
MPV: 10.7 FL — ABNORMAL LOW (ref 10.8–14.1)
NEUTROPHILS: 65 % (ref 43–78)
PLATELET: 292 10*3/uL (ref 150–450)
RBC: 4.88 M/uL (ref 4.23–5.67)
RDW: 12.7 % (ref 11.9–14.6)
WBC: 11.4 10*3/uL — ABNORMAL HIGH (ref 4.3–11.1)

## 2015-03-22 LAB — C REACTIVE PROTEIN, QT: C-Reactive protein: 1.1 mg/dL — ABNORMAL HIGH (ref 0.0–0.9)

## 2015-03-22 MED ORDER — SODIUM CHLORIDE 0.9 % IV PIGGY BACK
6004 mg/4 mL | INTRAVENOUS | Status: AC
Start: 2015-03-22 — End: 2015-03-22
  Administered 2015-03-22: 23:00:00 via INTRAVENOUS

## 2015-03-22 MED FILL — CLINDAMYCIN 600 MG/4 ML IV: 600 mg/4 mL | INTRAVENOUS | Qty: 4

## 2015-03-22 NOTE — ED Provider Notes (Signed)
HPI Comments: 29 year old Caucasian male presents stating that 4 days ago he started with a small pustule like pimple on his left elbow.  He states that it has gradually increased in size and now his left elbow is swollen and red.  He states over the last 24 hours it has had rapidly worsening increasing growth, redness and pain.  He denies any known injury or trauma to left elbow.  He is employed as an Buyer, retailairline pilot.    Patient is a 29 y.o. male presenting with skin problem. The history is provided by the patient.   Skin Problem   This is a new problem. The current episode started more than 2 days ago (4 DAYS AGO). The problem has been rapidly worsening. The problem is associated with an unknown factor. There has been no fever. The rash is present on the left arm (LEFT ELBOW). The pain is at a severity of 5/10. The pain is moderate. The pain has been constant since onset. Associated symptoms include pain. Pertinent negatives include no blisters, no itching and no weeping. He has tried nothing for the symptoms.        Past Medical History:   Diagnosis Date   ??? Gastrointestinal disorder      colitis       History reviewed. No pertinent past surgical history.      History reviewed. No pertinent family history.    History     Social History   ??? Marital Status: SINGLE     Spouse Name: N/A   ??? Number of Children: N/A   ??? Years of Education: N/A     Occupational History   ??? Not on file.     Social History Main Topics   ??? Smoking status: Never Smoker    ??? Smokeless tobacco: Never Used   ??? Alcohol Use: Yes   ??? Drug Use: Not on file   ??? Sexual Activity: Not on file     Other Topics Concern   ??? Not on file     Social History Narrative         ALLERGIES: Review of patient's allergies indicates no known allergies.    Review of Systems   Constitutional: Negative for fever and chills.   Gastrointestinal: Negative for nausea and vomiting.   Musculoskeletal: Positive for joint swelling and arthralgias.    Skin: Positive for wound. Negative for itching.   Neurological: Negative for numbness.   Psychiatric/Behavioral: The patient is nervous/anxious.        Filed Vitals:    03/22/15 1724   Pulse: 98   Temp: 98 ??F (36.7 ??C)   Resp: 16   Height: 5\' 9"  (1.753 m)   Weight: 65.772 kg (145 lb)   SpO2: 100%            Physical Exam   Constitutional: He is oriented to person, place, and time. Vital signs are normal. He appears well-developed and well-nourished. He is active.  Non-toxic appearance. He does not appear ill. No distress.   Cardiovascular:   Pulses:       Radial pulses are 2+ on the right side, and 2+ on the left side.   Musculoskeletal:        Left elbow: He exhibits swelling. He exhibits normal range of motion. Tenderness found.        Left wrist: Normal. He exhibits normal range of motion.        Arms:  ROM at left elbow intact as well as  left wrist.   Neurological: He is alert and oriented to person, place, and time.   Skin: Skin is warm. There is erythema.   See [facility] exam.   Psychiatric: His speech is normal and behavior is normal. His mood appears anxious.   Nursing note and vitals reviewed.       MDM  Number of Diagnoses or Management Options  Cellulitis of left upper extremity:   Diagnosis management comments: Pt. Also seen by Dr. Lou Miner who placed US probe on affected area of swelling. No purulent fluid like areas found. Still with clinical suspicion for purulent material within area of swelling so I&D attempted.    Pt. Also administered Clindamycin and will check CBC and CRP. Pt. Also seen by ED attending Dr. Lou Miner.       Amount and/or Complexity of Data Reviewed  Clinical lab tests: ordered and reviewed  Discuss the patient with other providers: yes    Risk of Complications, Morbidity, and/or Mortality  Presenting problems: moderate  Diagnostic procedures: low  Management options: moderate    Patient Progress  Patient progress: stable      I&D Abcess Simple   Consent: Verbal consent obtained. Written consent obtained.  Consent given by: patient  Date/Time: 03/22/2015 6:30 PM  Performed by: PASupervising provider: Jerlyn Ly, MD  Preparation: skin prepped with Betadine  Pre-procedure re-eval: Immediately prior to the procedure, the patient was reevaluated and found suitable for the planned procedure and any planned medications.  Location details: left elbow  Anesthesia: local infiltration  Local anesthetic: lidocaine 1% without epinephrine  Anesthetic total: 3 ml  Scalpel size: 11  Incision type: single straight  Complexity: simple  Drainage: bloody  Drainage amount: moderate  Wound treatment: wound left open  Post-procedure: dressing applied  Patient tolerance: Patient tolerated the procedure well with no immediate complications  My total time at bedside, performing this procedure was 1-15 minutes.        Recent Results (from the past 84 hour(s))   CBC WITH AUTOMATED DIFF    Collection Time: 03/22/15  6:52 PM   Result Value Ref Range    WBC 11.4 (H) 4.3 - 11.1 K/uL    RBC 4.88 4.23 - 5.67 M/uL    HGB 14.2 13.6 - 17.2 g/dL    HCT 16.1 09.6 - 04.5 %    MCV 86.9 79.6 - 97.8 FL    MCH 29.1 26.1 - 32.9 PG    MCHC 33.5 31.4 - 35.0 g/dL    RDW 40.9 81.1 - 91.4 %    PLATELET 292 150 - 450 K/uL    MPV 10.7 (L) 10.8 - 14.1 FL    DF AUTOMATED      NEUTROPHILS 65 43 - 78 %    LYMPHOCYTES 24 13 - 44 %    MONOCYTES 9 4.0 - 12.0 %    EOSINOPHILS 1 0.5 - 7.8 %    BASOPHILS 1 0.0 - 2.0 %    IMMATURE GRANULOCYTES 0.3 0.0 - 5.0 %    ABS. NEUTROPHILS 7.5 1.7 - 8.2 K/UL    ABS. LYMPHOCYTES 2.7 0.5 - 4.6 K/UL    ABS. MONOCYTES 1.0 0.1 - 1.3 K/UL    ABS. EOSINOPHILS 0.1 0.0 - 0.8 K/UL    ABS. BASOPHILS 0.1 0.0 - 0.2 K/UL    ABS. IMM. GRANS. 0.0 0.0 - 0.5 K/UL   C REACTIVE PROTEIN, QT    Collection Time: 03/22/15  6:52 PM   Result Value Ref Range  C-Reactive protein 1.1 (H) 0.0 - 0.9 mg/dL       XR ELBOW LT MIN 3 V   Final Result    IMPRESSION: Extensive soft tissue swelling over the extensor aspect of the left   elbow. No acute osseous abnormality or obvious joint effusion           I discussed the results of all labs, procedures, radiographs, and treatments with the patient and available family. Treatment plan is agreed upon and the patient is ready for discharge. All voiced understanding of the discharge plan and medication instructions or changes as appropriate. Questions about treatment in the ED were answered. All were encouraged to return should symptoms worsen or new problems develop.

## 2015-03-22 NOTE — ED Notes (Signed)
Received report from John, RN

## 2015-03-22 NOTE — ED Notes (Signed)
I have reviewed discharge instructions with the patient.  The patient verbalized understanding.

## 2015-03-22 NOTE — ED Notes (Signed)
Report given to Fred RN.

## 2015-03-22 NOTE — ED Notes (Signed)
Pt has abscess on left elbow that started out like a pimple or infected hair. It is now very large red area that is not draining.

## 2015-03-23 MED ORDER — CLINDAMYCIN 150 MG CAP
150 mg | ORAL_CAPSULE | Freq: Three times a day (TID) | ORAL | Status: AC
Start: 2015-03-23 — End: 2015-03-29

## 2015-03-24 ENCOUNTER — Inpatient Hospital Stay
Admit: 2015-03-24 | Discharge: 2015-03-26 | Disposition: A | Discharge status: Home / Self Care | Payer: PRIVATE HEALTH INSURANCE | Attending: Internal Medicine | Admitting: Internal Medicine

## 2015-03-24 DIAGNOSIS — L02414 Cutaneous abscess of left upper limb: Principal | ICD-10-CM

## 2015-03-24 LAB — CBC WITH AUTOMATED DIFF
ABS. BASOPHILS: 0 10*3/uL (ref 0.0–0.2)
ABS. EOSINOPHILS: 0.1 10*3/uL (ref 0.0–0.8)
ABS. IMM. GRANS.: 0 10*3/uL (ref 0.0–0.5)
ABS. LYMPHOCYTES: 2.4 10*3/uL (ref 0.5–4.6)
ABS. MONOCYTES: 1 10*3/uL (ref 0.1–1.3)
ABS. NEUTROPHILS: 7.9 10*3/uL (ref 1.7–8.2)
BASOPHILS: 0 % (ref 0.0–2.0)
EOSINOPHILS: 1 % (ref 0.5–7.8)
HCT: 42.1 % (ref 41.1–50.3)
HGB: 14.4 g/dL (ref 13.6–17.2)
IMMATURE GRANULOCYTES: 0.3 % (ref 0.0–5.0)
LYMPHOCYTES: 21 % (ref 13–44)
MCH: 29.8 PG (ref 26.1–32.9)
MCHC: 34.2 g/dL (ref 31.4–35.0)
MCV: 87 FL (ref 79.6–97.8)
MONOCYTES: 9 % (ref 4.0–12.0)
MPV: 10.9 FL (ref 10.8–14.1)
NEUTROPHILS: 69 % (ref 43–78)
PLATELET: 297 10*3/uL (ref 150–450)
RBC: 4.84 M/uL (ref 4.23–5.67)
RDW: 12.5 % (ref 11.9–14.6)
WBC: 11.5 10*3/uL — ABNORMAL HIGH (ref 4.3–11.1)

## 2015-03-24 LAB — METABOLIC PANEL, BASIC
Anion gap: 8 mmol/L (ref 7–16)
BUN: 14 MG/DL (ref 6–23)
CO2: 28 mmol/L (ref 21–32)
Calcium: 8.9 MG/DL (ref 8.3–10.4)
Chloride: 100 mmol/L (ref 98–107)
Creatinine: 1.17 MG/DL (ref 0.8–1.5)
GFR est AA: >60 mL/min/{1.73_m2} (ref >60)
GFR est non-AA: >60 mL/min/{1.73_m2} (ref >60)
Glucose: 93 mg/dL (ref 65–100)
Potassium: 4.1 mmol/L (ref 3.5–5.1)
Sodium: 136 mmol/L (ref 136–145)

## 2015-03-24 LAB — C REACTIVE PROTEIN, QT: C-Reactive protein: 8 mg/dL — ABNORMAL HIGH (ref 0.0–0.9)

## 2015-03-24 LAB — LACTIC ACID: Lactic acid: 0.6 MMOL/L (ref 0.4–2.0)

## 2015-03-24 MED ORDER — ADV ADDAPTOR
100 mg | Freq: Two times a day (BID) | Status: DC
Start: 2015-03-24 — End: 2015-03-24

## 2015-03-24 MED ORDER — VANCOMYCIN 1,000 MG IV SOLR
1000 mg | INTRAVENOUS | Status: DC
Start: 2015-03-24 — End: 2015-03-24

## 2015-03-24 MED ORDER — VANCOMYCIN IN 0.9 % SODIUM CHLORIDE 1.25 GRAM/250 ML IV
1.25 gram/250 mL | Freq: Once | INTRAVENOUS | Status: AC
Start: 2015-03-24 — End: 2015-03-24
  Administered 2015-03-24: via INTRAVENOUS

## 2015-03-24 MED ORDER — ACETAMINOPHEN 500 MG TAB
500 mg | ORAL | Status: AC
Start: 2015-03-24 — End: 2015-03-24

## 2015-03-24 MED ORDER — ADV ADDAPTOR
4.5 gram | Status: AC
Start: 2015-03-24 — End: 2015-03-24
  Administered 2015-03-25: 02:00:00 via INTRAVENOUS

## 2015-03-24 MED FILL — VANCOMYCIN 1,000 MG IV SOLR: 1000 mg | INTRAVENOUS | Qty: 1000

## 2015-03-24 MED FILL — DOXYCYCLINE HYCLATE 100 MG IV SOLUTION: 100 mg | INTRAVENOUS | Qty: 100

## 2015-03-24 MED FILL — VANCOMYCIN IN 0.9 % SODIUM CHLORIDE 1.25 GRAM/250 ML IV: 1.25 gram/250 mL | INTRAVENOUS | Qty: 250

## 2015-03-24 MED FILL — SODIUM CHLORIDE 0.9 % IV PIGGY BACK: INTRAVENOUS | Qty: 100

## 2015-03-24 NOTE — ED Notes (Signed)
Large swollen area on the right elbow.  Has been seen and treated for the same c/o last week

## 2015-03-24 NOTE — ED Provider Notes (Addendum)
HPI Comments: Patient presents with worsening elbow swelling and left arm redness.  Was seen in this department 2 days ago, incision and drainage was performed on left elbow, for presumed infected bursitis.  Minimal pus was extracted at that time.  He was discharged on clindamycin.  He returns today with increasing swelling warmth, continued purulent drainage out of left elbow, erythema extending up into the left axillary area.  Patient has a history of ulcerative colitis, takes immunosuppressants regularly.  He also expresses concern given his recent exposure to salt water.  He has family members who are in the medical field, who are voicing concern for possible salt water pathogens.  Labs, x-ray were performed 2 days ago.    Patient is a 29 y.o. male presenting with abscess. The history is provided by the patient.   Abscess   This is a new problem. The current episode started more than 2 days ago. The problem has been gradually worsening. There has been no fever. The rash is present on the left arm. The pain is moderate. The pain has been constant since onset. Associated symptoms include pain and weeping.        Past Medical History:   Diagnosis Date   ??? Gastrointestinal disorder      colitis       History reviewed. No pertinent past surgical history.      History reviewed. No pertinent family history.    History     Social History   ??? Marital Status: SINGLE     Spouse Name: N/A   ??? Number of Children: N/A   ??? Years of Education: N/A     Occupational History   ??? Not on file.     Social History Main Topics   ??? Smoking status: Never Smoker    ??? Smokeless tobacco: Never Used   ??? Alcohol Use: Yes   ??? Drug Use: No   ??? Sexual Activity: Not on file     Other Topics Concern   ??? Not on file     Social History Narrative         ALLERGIES: Review of patient's allergies indicates no known allergies.    Review of Systems   Constitutional: Negative for fever, chills, activity change and fatigue.    HENT: Negative for congestion, ear pain, sinus pressure and sore throat.    Eyes: Negative for pain and visual disturbance.   Respiratory: Negative for chest tightness and shortness of breath.    Cardiovascular: Negative for chest pain and palpitations.   Gastrointestinal: Negative for nausea, vomiting, abdominal pain and diarrhea.   Genitourinary: Negative for urgency, hematuria, flank pain and difficulty urinating.   Musculoskeletal: Negative for back pain and neck pain.   Skin: Negative for color change and rash.   Neurological: Negative for dizziness, weakness, numbness and headaches.   Hematological: Does not bruise/bleed easily.   Psychiatric/Behavioral: Negative for behavioral problems. The patient is not nervous/anxious.    All other systems reviewed and are negative.      Filed Vitals:    03/24/15 1646   BP: 112/90   Pulse: 87   Temp: 98.3 ??F (36.8 ??C)   Resp: 16   Height: 5\' 9"  (1.753 m)   Weight: 65.772 kg (145 lb)   SpO2: 98%            Physical Exam   Constitutional: He is oriented to person, place, and time. He appears well-developed and well-nourished.   HENT:   Head:  Normocephalic.   Right Ear: External ear normal.   Left Ear: External ear normal.   Eyes: EOM are normal. Pupils are equal, round, and reactive to light.   Neck: Normal range of motion. Neck supple.   Cardiovascular: Normal rate, regular rhythm, normal heart sounds and intact distal pulses.  Exam reveals no gallop and no friction rub.    No murmur heard.  Pulmonary/Chest: Effort normal and breath sounds normal. No respiratory distress. He has no wheezes. He has no rales.   Abdominal: Soft. Bowel sounds are normal. He exhibits no distension. There is no tenderness. There is no rebound and no guarding.   Musculoskeletal: He exhibits edema and tenderness.   Left elbow with erythematous, fluctuant swelling noted.  Erythema extending up into the left axillary area.  Strong pulses, no subcutaneous air.    Neurological: He is alert and oriented to person, place, and time. No cranial nerve deficit.   Skin: Skin is warm and dry. No rash noted. No erythema.   Psychiatric: He has a normal mood and affect. His behavior is normal.   Nursing note and vitals reviewed.       MDM  Number of Diagnoses or Management Options  Abscess of bursa of left elbow:   Cellulitis of upper extremity, unspecified laterality:   Diagnosis management comments: Patient essentially failing outpatient oral antibiotics.  History and exam most consistent with MRSA infection.  However, patient also immune compromised due to his medications recent saltwater exposure.  Covering for vibrio as well as the more common pathogens.  Repeated incision and drainage with expression of purulent material.  Sent for culture. Pt requesting tylenol, declines any narcotics    8:04 PM  Discussed with Dr Milda Smart, who will admit      I&D Abcess Simple  Consent: Verbal consent obtained.  Consent given by: patient  Date/Time: 03/24/2015 8:05 PM  Performed by: attendingPreparation: skin prepped with Betadine  Pre-procedure re-eval: Immediately prior to the procedure, the patient was reevaluated and found suitable for the planned procedure and any planned medications.  Location details: left elbow  Anesthesia: local infiltration  Local anesthetic: lidocaine 1% without epinephrine  Scalpel size: 11  Incision type: single straight  Complexity: simple  Drainage: purulent and  bloody  Drainage amount: moderate  Wound treatment: wound left open  My total time at bedside, performing this procedure was 1-15 minutes.

## 2015-03-24 NOTE — ED Notes (Signed)
Patient has abscess to elbow with increased swelling to upper arm and axillary. Patient was treated in ED on July 4th with clindamycin.

## 2015-03-24 NOTE — H&P (Addendum)
HOSPITALIST H&P    NAME:  George Hammond   Age:  29 y.o.  DOB:   [birth year]   MRN:   [id number]  PCP: Sol Blazing, MD  Chief complaint: abscess    Subjective:     Patient is a 29 y.o. white  male who presents with abscess on left elbow.  Pt states area started as a small white spot about 2 days ago and has rapidly enlarged and reddened.  He says it is painful of moderate intensity.  He denies fever, chest pain, dyspnea, abdominal pain, nausea, vomiting, diarrhea, chills, or myalgias.  He was prescribed clindamycin, but came to the ED because symptoms worsened.    Past Medical History   Diagnosis Date   ??? Ulcerative colitis (HCC)        History reviewed. No pertinent past surgical history.    No current facility-administered medications on file prior to encounter.     Current Outpatient Prescriptions on File Prior to Encounter   Medication Sig Dispense Refill   ??? mesalamine (CANASA) 1,000 mg suppository Insert  into rectum two (2) times a day.     ??? budesonide 9 mg TaDE Take  by mouth.     ??? clindamycin (CLEOCIN) 150 mg capsule Take 2 Caps by mouth three (3) times daily for 7 days. 42 Cap 0       No Known Allergies    History   Substance Use Topics   ??? Smoking status: Never Smoker    ??? Smokeless tobacco: Never Used   ??? Alcohol Use: Yes       History reviewed. No pertinent family history.    I have personally reviewed and reconciled patients history.    Review of Systems  A comprehensive 12 point review of systems is negative other than what is listed above.    Objective:     Patient Vitals for the past 24 hrs:   BP Temp Pulse Resp SpO2 Height Weight   03/24/15 2110 127/85 mmHg 98.9 ??F (37.2 ??C) 76 16 99 % - -   03/24/15 2054 145/85 mmHg - 79 16 98 % - -   03/24/15 1646 112/90 mmHg 98.3 ??F (36.8 ??C) 87 16 98 %  (1.753 m) 65.772 kg (145 lb)       Exam:  General: awake, alert, no apparent distress  Eyes: PERRL, anicteric  Neck: Supple, trachea midline   Lungs: Clear to auscultation bilaterally.  No rales, wheezes, or rhonchi  Heart: Regular rate and rhythm.  No appreciable murmur.  Abdomen: Soft, nontender, nondistended.  Bowel sounds normal.  No rebound tenderness, guarding, or rigidity.  Extremities:  No LE edema.  Skin: Warm/dry. Left elbow erythematous with puncture site from I&D.  No crepitus or fluctuance.  Induration present with area about 4cm x 4cm over olecranon process.  Neurologic: CN II-XII grossly intact bilaterally.  Psych: AOx3.  Normal mood and affect.      Data Review (Labs):   Recent Results (from the past 24 hour(s))   CBC WITH AUTOMATED DIFF    Collection Time: 03/24/15  6:38 PM   Result Value Ref Range    WBC 11.5 (H) 4.3 - 11.1 K/uL    RBC 4.84 4.23 - 5.67 M/uL    HGB 14.4 13.6 - 17.2 g/dL    HCT 86.5 78.4 - 69.6 %    MCV 87.0 79.6 - 97.8 FL    MCH 29.8 26.1 - 32.9 PG    MCHC 34.2 31.4 -  35.0 g/dL    RDW 16.112.5 09.611.9 - 04.514.6 %    PLATELET 297 150 - 450 K/uL    MPV 10.9 10.8 - 14.1 FL    DF AUTOMATED      NEUTROPHILS 69 43 - 78 %    LYMPHOCYTES 21 13 - 44 %    MONOCYTES 9 4.0 - 12.0 %    EOSINOPHILS 1 0.5 - 7.8 %    BASOPHILS 0 0.0 - 2.0 %    IMMATURE GRANULOCYTES 0.3 0.0 - 5.0 %    ABS. NEUTROPHILS 7.9 1.7 - 8.2 K/UL    ABS. LYMPHOCYTES 2.4 0.5 - 4.6 K/UL    ABS. MONOCYTES 1.0 0.1 - 1.3 K/UL    ABS. EOSINOPHILS 0.1 0.0 - 0.8 K/UL    ABS. BASOPHILS 0.0 0.0 - 0.2 K/UL    ABS. IMM. GRANS. 0.0 0.0 - 0.5 K/UL   METABOLIC PANEL, BASIC    Collection Time: 03/24/15  6:38 PM   Result Value Ref Range    Sodium 136 136 - 145 mmol/L    Potassium 4.1 3.5 - 5.1 mmol/L    Chloride 100 98 - 107 mmol/L    CO2 28 21 - 32 mmol/L    Anion gap 8 7 - 16 mmol/L    Glucose 93 65 - 100 mg/dL    BUN 14 6 - 23 MG/DL    Creatinine 4.091.17 0.8 - 1.5 MG/DL    GFR est AA >81>60 >19>60 JY/NWG/9.56O1ml/min/1.73m2    GFR est non-AA >60 >60 ml/min/1.7473m2    Calcium 8.9 8.3 - 10.4 MG/DL   C REACTIVE PROTEIN, QT    Collection Time: 03/24/15  6:38 PM   Result Value Ref Range     C-Reactive protein 8.0 (H) 0.0 - 0.9 mg/dL   LACTIC ACID, PLASMA    Collection Time: 03/24/15  6:38 PM   Result Value Ref Range    Lactic acid 0.6 0.4 - 2.0 MMOL/L       Assessment:   Principal Problem:    Cellulitis of elbow (03/24/2015)    Active Problems:    Abscess of elbow (03/24/2015)      Ulcerative colitis (HCC) (03/24/2015)        Plan:     Admit to medical floor  Vancomycin  Abscess drained and cultured in ED, await susceptibilities  Continue home medications  Likely MRSA.  May be resistant to clindamycin, but patient took it for only about a day.    DVT prophylaxis: Lovenox  Code Status: FULL    Plan of care discussed with: patient  Time spent on patient care: 30 minutes  Anticipated date of discharge: 3 days    Marthe PatchAlan D Sagal Gayton, DO

## 2015-03-24 NOTE — Other (Signed)
Report called to [place], RN on 3rd floor.  IV sited intact and Vancomycin infusing.  Transported to Rm 324 via stretcher

## 2015-03-24 NOTE — Progress Notes (Signed)
TRANSFER - IN REPORT:    Verbal report received from [facility]Elizabeth,RN on George Hammond  being received from ED for routine progression of care      Report consisted of patient???s Situation, Background, Assessment and   Recommendations(SBAR).     Information from the following report(s) SBAR, Kardex, Intake/Output, MAR and Recent Results was reviewed with the receiving nurse.    Opportunity for questions and clarification was provided.      Assessment will be completed upon patient???s arrival to unit and care will be assumed.

## 2015-03-24 NOTE — Progress Notes (Signed)
Assessment completed via doc flowsheet, pt alert and oriented x4, respirations present, even and unlabored, HOB elevated, pt denies any SOB at this time, S1&S2 auscultated, HR regular, abd soft, non-tender, bowel sounds present in +4 quadrants, pt denies any N/V, pt is up ad lib, voiding without difficulty, IVF infusing without difficulty, L elbow w/redness, warmth, and edema, wrapped in ace bandage at this time, pulses present and palpable, capillary refill less than 3 seconds, pt denies any pain at this time, pt instructed to call for assistance, oriented to room and call light, bed low and locked, side rails x3, call light within reach.    Dual skin assessment performed with George IslamKim Davis, RN, skin WDL with the exception of L elbow which is noted above, no open cuts, sores or other wounds noted.

## 2015-03-25 MED ORDER — PHARMACY VANCOMYCIN NOTE
Status: DC
Start: 2015-03-25 — End: 2015-03-25

## 2015-03-25 MED ORDER — ENOXAPARIN 40 MG/0.4 ML SUB-Q SYRINGE
40 mg/0.4 mL | SUBCUTANEOUS | Status: DC
Start: 2015-03-25 — End: 2015-03-26

## 2015-03-25 MED ORDER — MESALAMINE 1,000 MG RECTAL SUPPOSITORY
1000 mg | Freq: Two times a day (BID) | RECTAL | Status: DC
Start: 2015-03-25 — End: 2015-03-25
  Administered 2015-03-25: 22:00:00 via RECTAL

## 2015-03-25 MED ORDER — ACETAMINOPHEN 325 MG TABLET
325 mg | ORAL | Status: DC | PRN
Start: 2015-03-25 — End: 2015-03-26

## 2015-03-25 MED ORDER — MESALAMINE 1,000 MG RECTAL SUPPOSITORY
1000 mg | Freq: Every evening | RECTAL | Status: DC
Start: 2015-03-25 — End: 2015-03-26

## 2015-03-25 MED ORDER — BUDESONIDE SR 3 MG 24 HR CAP
3 mg | Freq: Every day | ORAL | Status: DC
Start: 2015-03-25 — End: 2015-03-26

## 2015-03-25 MED ORDER — HYDROCODONE-ACETAMINOPHEN 5 MG-325 MG TAB
5-325 mg | ORAL | Status: DC | PRN
Start: 2015-03-25 — End: 2015-03-26

## 2015-03-25 MED ORDER — SODIUM CHLORIDE 0.9 % IJ SYRG
INTRAMUSCULAR | Status: DC | PRN
Start: 2015-03-25 — End: 2015-03-26

## 2015-03-25 MED ORDER — ACETAMINOPHEN 500 MG TAB
500 mg | ORAL | Status: AC
Start: 2015-03-25 — End: 2015-03-24
  Administered 2015-03-25: 01:00:00 via ORAL

## 2015-03-25 MED ORDER — MORPHINE 2 MG/ML INJECTION
2 mg/mL | INTRAMUSCULAR | Status: DC | PRN
Start: 2015-03-25 — End: 2015-03-26

## 2015-03-25 MED ORDER — SODIUM CHLORIDE 0.9 % IV PIGGY BACK
1000 mg | Freq: Two times a day (BID) | INTRAVENOUS | Status: DC
Start: 2015-03-25 — End: 2015-03-26
  Administered 2015-03-25 – 2015-03-26 (×3): via INTRAVENOUS

## 2015-03-25 MED ORDER — SODIUM CHLORIDE 0.9 % IJ SYRG
Freq: Three times a day (TID) | INTRAMUSCULAR | Status: DC
Start: 2015-03-25 — End: 2015-03-26
  Administered 2015-03-25 – 2015-03-26 (×5): via INTRAVENOUS

## 2015-03-25 MED FILL — CANASA 1,000 MG RECTAL SUPPOSITORY: 1000 mg | RECTAL | Qty: 1

## 2015-03-25 MED FILL — MAPAP EXTRA STRENGTH 500 MG TABLET: 500 mg | ORAL | Qty: 2

## 2015-03-25 MED FILL — ENTOCORT EC 3 MG CAPSULE,DELAYED,EXTENDED RELEASE: 3 mg | ORAL | Qty: 3

## 2015-03-25 MED FILL — VANCOMYCIN 1,000 MG IV SOLR: 1000 mg | INTRAVENOUS | Qty: 1000

## 2015-03-25 MED FILL — PHARMACY VANCOMYCIN NOTE: Qty: 1

## 2015-03-25 MED FILL — LOVENOX 40 MG/0.4 ML SUBCUTANEOUS SYRINGE: 40 mg/0.4 mL | SUBCUTANEOUS | Qty: 0.4

## 2015-03-25 MED FILL — PIPERACILLIN-TAZOBACTAM 4.5 GRAM IV SOLR: 4.5 gram | INTRAVENOUS | Qty: 4.5

## 2015-03-25 NOTE — Progress Notes (Signed)
AM assessment . Pt is A/O x 4 .Lungs clear and respirations even and unlabored. Abdomen is soft and bowel sounds x 4 . Left elbow swelling noted and ace wrap dry and intact . No c/o pain this am

## 2015-03-25 NOTE — Progress Notes (Signed)
Continues to rest quietly, awake, resp even, unlab, skin warm, dry. Left elbow incision intact, pink with trace edema. Reports comfortable, voiding without difficulty. No distress.

## 2015-03-25 NOTE — Progress Notes (Signed)
Resting quietly, awake with no c/o.  No distress noted. Bedside report received from pt's previous nurse, Megan Wrenn, RN.

## 2015-03-25 NOTE — Progress Notes (Signed)
Pharmacokinetic Consult to Pharmacist    Juanell Fairlyyler Milan is a 29 y.o. male being treated for cellulitis with Vancomycin.    @FLOW (11)@  @FLOW (14)@  Lab Results   Component Value Date/Time    BUN 14 03/24/2015 06:38 PM    CREATININE 1.17 03/24/2015 06:38 PM    WBC 11.5 03/24/2015 06:38 PM      Estimated Creatinine Clearance: 85.9 mL/min (based on Cr of 1.17).    CULTURES:  pendint    Day 2 of vancomycin.  Goal trough is 12 - 18.  Vancomycin dose continued at 1 gram IV every 12 hours.   Will continue to follow patient.      Thank you,  Artemio AlyKevin E Sakib Noguez PharmD, BCPS

## 2015-03-25 NOTE — Progress Notes (Signed)
Understands rationale for Lovenox, continues to refuse.

## 2015-03-25 NOTE — Progress Notes (Signed)
Hospitalist Progress Note    03/25/2015  Admit Date: 03/24/2015  5:06 PM   NAME: George Hammond   DOB:  [birth year]   MRN:  [id number]   Attending: Luane School, MD  PCP:  Sol Blazing, MD  Treatment Team: Attending Provider: Luane School, MD    Full Code     SUBJECTIVE:   29 yo male admitted with left elbow abscess     Patient LUE feeling better with less redness, still swollen         Recent Results (from the past 24 hour(s))   CULTURE, BLOOD    Collection Time: 03/24/15  6:35 PM   Result Value Ref Range    Special Requests: RIGHT ANTECUBITAL      Culture result: NO GROWTH AFTER 13 HOURS     CBC WITH AUTOMATED DIFF    Collection Time: 03/24/15  6:38 PM   Result Value Ref Range    WBC 11.5 (H) 4.3 - 11.1 K/uL    RBC 4.84 4.23 - 5.67 M/uL    HGB 14.4 13.6 - 17.2 g/dL    HCT 40.9 81.1 - 91.4 %    MCV 87.0 79.6 - 97.8 FL    MCH 29.8 26.1 - 32.9 PG    MCHC 34.2 31.4 - 35.0 g/dL    RDW 78.2 95.6 - 21.3 %    PLATELET 297 150 - 450 K/uL    MPV 10.9 10.8 - 14.1 FL    DF AUTOMATED      NEUTROPHILS 69 43 - 78 %    LYMPHOCYTES 21 13 - 44 %    MONOCYTES 9 4.0 - 12.0 %    EOSINOPHILS 1 0.5 - 7.8 %    BASOPHILS 0 0.0 - 2.0 %    IMMATURE GRANULOCYTES 0.3 0.0 - 5.0 %    ABS. NEUTROPHILS 7.9 1.7 - 8.2 K/UL    ABS. LYMPHOCYTES 2.4 0.5 - 4.6 K/UL    ABS. MONOCYTES 1.0 0.1 - 1.3 K/UL    ABS. EOSINOPHILS 0.1 0.0 - 0.8 K/UL    ABS. BASOPHILS 0.0 0.0 - 0.2 K/UL    ABS. IMM. GRANS. 0.0 0.0 - 0.5 K/UL   METABOLIC PANEL, BASIC    Collection Time: 03/24/15  6:38 PM   Result Value Ref Range    Sodium 136 136 - 145 mmol/L    Potassium 4.1 3.5 - 5.1 mmol/L    Chloride 100 98 - 107 mmol/L    CO2 28 21 - 32 mmol/L    Anion gap 8 7 - 16 mmol/L    Glucose 93 65 - 100 mg/dL    BUN 14 6 - 23 MG/DL    Creatinine 0.86 0.8 - 1.5 MG/DL    GFR est AA >57 >84 ON/GEX/5.28U1    GFR est non-AA >60 >60 ml/min/1.64m2    Calcium 8.9 8.3 - 10.4 MG/DL   C REACTIVE PROTEIN, QT    Collection Time: 03/24/15  6:38 PM   Result Value Ref Range     C-Reactive protein 8.0 (H) 0.0 - 0.9 mg/dL   LACTIC ACID, PLASMA    Collection Time: 03/24/15  6:38 PM   Result Value Ref Range    Lactic acid 0.6 0.4 - 2.0 MMOL/L   CULTURE, BLOOD    Collection Time: 03/24/15  6:42 PM   Result Value Ref Range    Special Requests: RIGHT ANTECUBITAL      Culture result: NO GROWTH AFTER 13 HOURS     CULTURE,  WOUND Gay Filler STAIN    Collection Time: 03/24/15  8:01 PM   Result Value Ref Range    Special Requests: LEFT  ELBOW        GRAM STAIN 0 TO 3  WBCS SEEN        GRAM STAIN NO DEFINITE ORGANISM SEEN      Culture result:        NO GROWTH AFTER SHORT PERIOD OF INCUBATION. FURTHER RESULTS TO FOLLOW AFTER OVERNIGHT INCUBATION.       No Known Allergies  Current Facility-Administered Medications   Medication Dose Route Frequency Provider Last Rate Last Dose   ??? budesonide (ENTOCORT EC) capsule 9 mg  9 mg Oral DAILY Marthe Patch, DO   9 mg at 03/25/15 0900   ??? mesalamine (CANASA) suppository 1,000 mg  1,000 mg Rectal BID Marthe Patch, DO   Stopped at 03/25/15 0900   ??? sodium chloride (NS) flush 5-10 mL  5-10 mL IntraVENous Q8H Marthe Patch, DO   10 mL at 03/25/15 0533   ??? sodium chloride (NS) flush 5-10 mL  5-10 mL IntraVENous PRN Marthe Patch, DO       ??? acetaminophen (TYLENOL) tablet 650 mg  650 mg Oral Q4H PRN Marthe Patch, DO       ??? HYDROcodone-acetaminophen (NORCO) 5-325 mg per tablet 1 Tab  1 Tab Oral Q4H PRN Marthe Patch, DO       ??? morphine injection 2 mg  2 mg IntraVENous Q4H PRN Marthe Patch, DO       ??? enoxaparin (LOVENOX) injection 40 mg  40 mg SubCUTAneous Q24H Marthe Patch, DO   40 mg at 03/24/15 2222   ??? vancomycin (VANCOCIN) 1,000 mg in 0.9% sodium chloride (MBP/ADV) 250 mL  1,000 mg IntraVENous Q12H Marthe Patch, DO 250 mL/hr at 03/25/15 0850 1,000 mg at 03/25/15 0850           Review of Systems negative with exception of pertinent positives noted above  PHYSICAL EXAM    BP 127/74 mmHg   Pulse 66   Temp(Src) 97.8 ??F (36.6 ??C)   Resp 20   Ht 5\' 9"  (1.753 m)   Wt 65.772 kg (145 lb)   BMI 21.40 kg/m2   SpO2 99%   Temp (24hrs), Avg:97.9 ??F (36.6 ??C), Min:96.7 ??F (35.9 ??C), Max:98.9 ??F (37.2 ??C)    Oxygen Therapy  O2 Sat (%): 99 % (03/25/15 1135)  O2 Device: Room air (03/24/15 2110)    Intake/Output Summary (Last 24 hours) at 03/25/15 1404  Last data filed at 03/24/15 2243   Gross per 24 hour   Intake    150 ml   Output      0 ml   Net    150 ml      General: No acute distress????  Lungs:  CTA  Heart:  RRR  Abdomen: Soft,+BS  Extremities: Left elbow swelling with minimal erythema, non-tender       DIAGNOSTIC STUDIES    Left elbow.XR  CLINICAL INDICATION: Cellulitis for 36 hours  FINDINGS: Three views of the left elbow show extensive soft tissue swelling over  the extensor aspect of the left elbow. No joint effusion evident. No destructive  bone changes present to suspect osteomyelitis.  IMPRESSION: Extensive soft tissue swelling over the extensor aspect of the left  elbow. No acute osseous abnormality or obvious joint effusion  ??  ASSESSMENT      Active Hospital Problems  Diagnosis Date Noted   ??? Cellulitis of elbow 03/24/2015   ??? Abscess of elbow 03/24/2015   ??? Ulcerative colitis (HCC) 03/24/2015       Plan:  CONTINUE ABX  FOLLOW CULTURES     DVT Prophylaxis: LOVENOX   Plan of Care Discussed with: Supervising MD    [name], PA

## 2015-03-26 LAB — CBC WITH AUTOMATED DIFF
ABS. BASOPHILS: 0 10*3/uL (ref 0.0–0.2)
ABS. EOSINOPHILS: 0.2 10*3/uL (ref 0.0–0.8)
ABS. IMM. GRANS.: 0 10*3/uL (ref 0.0–0.5)
ABS. LYMPHOCYTES: 2.4 10*3/uL (ref 0.5–4.6)
ABS. MONOCYTES: 0.7 10*3/uL (ref 0.1–1.3)
ABS. NEUTROPHILS: 4.9 10*3/uL (ref 1.7–8.2)
BASOPHILS: 1 % (ref 0.0–2.0)
EOSINOPHILS: 3 % (ref 0.5–7.8)
HCT: 40.6 % — ABNORMAL LOW (ref 41.1–50.3)
HGB: 13.3 g/dL — ABNORMAL LOW (ref 13.6–17.2)
IMMATURE GRANULOCYTES: 0.1 % (ref 0.0–5.0)
LYMPHOCYTES: 28 % (ref 13–44)
MCH: 28.5 PG (ref 26.1–32.9)
MCHC: 32.8 g/dL (ref 31.4–35.0)
MCV: 86.9 FL (ref 79.6–97.8)
MONOCYTES: 9 % (ref 4.0–12.0)
MPV: 10.8 FL (ref 10.8–14.1)
NEUTROPHILS: 59 % (ref 43–78)
PLATELET: 287 10*3/uL (ref 150–450)
RBC: 4.67 M/uL (ref 4.23–5.67)
RDW: 12.5 % (ref 11.9–14.6)
WBC: 8.3 10*3/uL (ref 4.3–11.1)

## 2015-03-26 LAB — METABOLIC PANEL, BASIC
Anion gap: 7 mmol/L (ref 7–16)
BUN: 14 MG/DL (ref 6–23)
CO2: 27 mmol/L (ref 21–32)
Calcium: 9 MG/DL (ref 8.3–10.4)
Chloride: 103 mmol/L (ref 98–107)
Creatinine: 0.96 MG/DL (ref 0.8–1.5)
GFR est AA: >60 mL/min/{1.73_m2} (ref >60)
GFR est non-AA: >60 mL/min/{1.73_m2} (ref >60)
Glucose: 95 mg/dL (ref 65–100)
Potassium: 4.3 mmol/L (ref 3.5–5.1)
Sodium: 137 mmol/L (ref 136–145)

## 2015-03-26 MED ORDER — TRIMETHOPRIM-SULFAMETHOXAZOLE 160 MG-800 MG TAB
160-800 mg | ORAL_TABLET | Freq: Two times a day (BID) | ORAL | Status: DC
Start: 2015-03-26 — End: 2015-03-26

## 2015-03-26 MED ORDER — TRIMETHOPRIM-SULFAMETHOXAZOLE 160 MG-800 MG TAB
160-800 mg | ORAL_TABLET | Freq: Two times a day (BID) | ORAL | Status: AC
Start: 2015-03-26 — End: 2015-04-02

## 2015-03-26 MED FILL — CANASA 1,000 MG RECTAL SUPPOSITORY: 1000 mg | RECTAL | Qty: 1

## 2015-03-26 MED FILL — VANCOMYCIN 1,000 MG IV SOLR: 1000 mg | INTRAVENOUS | Qty: 1000

## 2015-03-26 MED FILL — ENTOCORT EC 3 MG CAPSULE,DELAYED,EXTENDED RELEASE: 3 mg | ORAL | Qty: 3

## 2015-03-26 MED FILL — LOVENOX 40 MG/0.4 ML SUBCUTANEOUS SYRINGE: 40 mg/0.4 mL | SUBCUTANEOUS | Qty: 0.4

## 2015-03-26 NOTE — Progress Notes (Signed)
Awoke easily. Left elbow remains pink with 1+ edema. No c/o. No distress. Bedside report given to oncoming nurse, Baldo AshPortia Botchway, RN.

## 2015-03-26 NOTE — Progress Notes (Signed)
HOB elevated, no acute distress, left elbow edematous, not tender, without drainage, open to air, no complaints of calf pain.

## 2015-03-26 NOTE — Progress Notes (Signed)
Discharge instructions given verbalized understanding.

## 2015-03-26 NOTE — Progress Notes (Signed)
Resting quietly, resp even, unlab.  Eyes closed with relaxed facial expression.  No distress noted.

## 2015-03-26 NOTE — Progress Notes (Signed)
Continues to rest quietly, resp even, unlab, arousing easily at intervals with no c/o. No distress noted.

## 2015-03-26 NOTE — Discharge Summary (Signed)
Hospitalist Discharge Summary     Patient ID:  George Hammond  [id number]  29 y.o.  05/04/1986  Admit date: 03/24/2015  5:06 PM  Discharge date and time: 03/26/2015  Attending: Luane School, MD  PCP:  Sol Blazing, MD  Treatment Team: Attending Provider: Luane School, MD    Principal Diagnosis Cellulitis of elbow   Principal Problem:    Cellulitis of elbow (03/24/2015)    Active Problems:    Abscess of elbow (03/24/2015)      Ulcerative colitis (HCC) (03/24/2015)       * Admission Diagnoses: Cellulitis of leg    * Discharge Diagnoses:    Hospital Problems as of 03/26/2015  Never Reviewed          Codes Class Noted - Resolved POA    * (Principal)Cellulitis of elbow ICD-10-CM: L03.119  ICD-9-CM: 682.3  03/24/2015 - Present Yes        Abscess of elbow ICD-10-CM: L02.419  ICD-9-CM: 682.3  03/24/2015 - Present Yes        Ulcerative colitis (HCC) ICD-10-CM: K51.90  ICD-9-CM: 556.9  03/24/2015 - Present Yes                Hospital Course:  Please refer to the admission H&P for details of presentation. In summary, the patient is a 29 yo male admitted with left elbow abscess. He was on clindamycin for 2 days prior to admission and presented to the ED with worsening symptoms. Patient underwent I&D in the ED with wound cultures obtained. He was placed on Vancomycin. Left elbow redness and swelling have improved. Leukocytosis has resolved. He is afebrile. Wound and blood cultures are negative at this time. Patient has been discussed with Dr Wallace Cullens and is considered stable for discharge today. Patient has been instructed to resume clindamycin. He has also been given a prescription for Bactrim with instructions to stop Clindamycin and start Bactrim if worsening symptoms develop. He states he will follow up with Dr Nila Nephew in [place], [place] in one week.     Significant Diagnostic Studies/Procedures:   Left elbow.  CLINICAL INDICATION: Cellulitis for 36 hours  FINDINGS: Three views of the left elbow show extensive soft tissue swelling over   the extensor aspect of the left elbow. No joint effusion evident. No destructive  bone changes present to suspect osteomyelitis.  IMPRESSION: Extensive soft tissue swelling over the extensor aspect of the left  elbow. No acute osseous abnormality or obvious joint effusion    Labs: Results:       Chemistry Recent Labs      03/26/15   0542  03/24/15   1838   GLU  95  93   NA  137  136   K  4.3  4.1   CL  103  100   CO2  27  28   BUN  14  14   CREA  0.96  1.17   CA  9.0  8.9   AGAP  7  8      CBC w/Diff Recent Labs      03/26/15   0542  03/24/15   1838   WBC  8.3  11.5*   RBC  4.67  4.84   HGB  13.3*  14.4 HCT  40.6*  42.1   PLT  287  297   GRANS  59  69   LYMPH  28  21   EOS  3  1  Discharge Exam:  BP 123/78 mmHg   Pulse 79   Temp(Src) 97.3 ??F (36.3 ??C)   Resp 18   Ht 5\' 9"  (1.753 m)   Wt 65.772 kg (145 lb)   BMI 21.40 kg/m2   SpO2 98%  General appearance: alert, cooperative, no distress, appears stated age  Lungs: clear to auscultation bilaterally  Heart: regular rate and rhythm  Abdomen: soft, non-tender. Bowel sounds normal.   Extremities: no cyanosis, minimal swelling and erythema of left elbow       Disposition: home  Discharge Condition: stable  Patient Instructions:   Current Discharge Medication List      START taking these medications    Details   trimethoprim-sulfamethoxazole (BACTRIM DS) 160-800 mg per tablet Take 1 Tab by mouth two (2) times a day for 7 days. [name] [facility] ZOX#[id number]#[id number]  Qty: 14 Tab, Refills: 0         CONTINUE these medications which have NOT CHANGED    Details   mesalamine (CANASA) 1,000 mg suppository Insert  into rectum nightly.      budesonide 9 mg TaDE Take  by mouth.      clindamycin (CLEOCIN) 150 mg capsule Take 2 Caps by mouth three (3) times daily for 7 days.  Qty: 42 Cap, Refills: 0             Activity: Activity as tolerated  Diet: Regular Diet  Wound Care: keep clean and dry       Full Code   Follow-up  Enrique SackEdwin Jay Green MD    [address], [place], [place] [address]   Phone: [phone]  Fax: [phone]  Time spent to discharge patient 20 minutes   Signed:  [name], PA  03/26/2015  9:25 AM

## 2015-03-27 LAB — CULTURE, WOUND W GRAM STAIN
Culture result:: NO GROWTH
GRAM STAIN: 0
GRAM STAIN: NONE SEEN

## 2015-03-29 LAB — CULTURE, BLOOD
Culture result:: NO GROWTH
Culture result:: NO GROWTH

## 2015-04-20 ENCOUNTER — Telehealth: Payer: Self-pay | Admitting: Gastroenterology

## 2015-04-20 MED ORDER — MESALAMINE 1000 MG RE SUPP: RECTAL | Status: AC

## 2015-04-20 MED ORDER — MESALAMINE 1.2 G PO TBEC: DELAYED_RELEASE_TABLET | ORAL | Status: AC

## 2015-04-20 NOTE — Telephone Encounter (Signed)
Patient advised that we are unaware of any MDs in [place]

## 2015-04-21 ENCOUNTER — Ambulatory Visit: Payer: 59 | Admitting: Gastroenterology
